# Patient Record
Sex: Female | Born: 1945 | Race: White | Hispanic: Yes | Marital: Married | Smoking: Former smoker
Health system: Southern US, Community
[De-identification: ages and names within clinical notes are randomized; demographics above are authoritative.]

## PROBLEM LIST (undated history)

## (undated) DIAGNOSIS — D179 Benign lipomatous neoplasm, unspecified: Secondary | ICD-10-CM

## (undated) DIAGNOSIS — E2839 Other primary ovarian failure: Secondary | ICD-10-CM

## (undated) DIAGNOSIS — Z1231 Encounter for screening mammogram for malignant neoplasm of breast: Secondary | ICD-10-CM

## (undated) DIAGNOSIS — R42 Dizziness and giddiness: Secondary | ICD-10-CM

## (undated) DIAGNOSIS — Z0000 Encounter for general adult medical examination without abnormal findings: Secondary | ICD-10-CM

## (undated) DIAGNOSIS — R519 Headache, unspecified: Secondary | ICD-10-CM

## (undated) DIAGNOSIS — F4541 Pain disorder exclusively related to psychological factors: Secondary | ICD-10-CM

## (undated) DIAGNOSIS — E559 Vitamin D deficiency, unspecified: Secondary | ICD-10-CM

## (undated) DIAGNOSIS — R21 Rash and other nonspecific skin eruption: Secondary | ICD-10-CM

## (undated) DIAGNOSIS — H9190 Unspecified hearing loss, unspecified ear: Secondary | ICD-10-CM

## (undated) DIAGNOSIS — E7800 Pure hypercholesterolemia, unspecified: Secondary | ICD-10-CM

## (undated) DIAGNOSIS — M8580 Other specified disorders of bone density and structure, unspecified site: Secondary | ICD-10-CM

## (undated) DIAGNOSIS — R51 Headache: Secondary | ICD-10-CM

## (undated) DIAGNOSIS — Z8049 Family history of malignant neoplasm of other genital organs: Secondary | ICD-10-CM

## (undated) HISTORY — DX: Dizziness and giddiness: R42

## (undated) HISTORY — DX: Encounter for screening mammogram for malignant neoplasm of breast: Z12.31

## (undated) HISTORY — PX: TONSILLECTOMY: SUR1361

## (undated) HISTORY — DX: Encounter for general adult medical examination without abnormal findings: Z00.00

## (undated) HISTORY — DX: Pain disorder exclusively related to psychological factors: F45.41

## (undated) HISTORY — DX: Benign lipomatous neoplasm, unspecified: D17.9

## (undated) HISTORY — DX: Vitamin D deficiency, unspecified: E55.9

## (undated) HISTORY — DX: Rash and other nonspecific skin eruption: R21

## (undated) HISTORY — DX: Other primary ovarian failure: E28.39

## (undated) HISTORY — DX: Other specified disorders of bone density and structure, unspecified site: M85.80

## (undated) HISTORY — DX: Pure hypercholesterolemia, unspecified: E78.00

## (undated) HISTORY — DX: Unspecified hearing loss, unspecified ear: H91.90

## (undated) HISTORY — DX: Family history of malignant neoplasm of other genital organs: Z80.49

## (undated) HISTORY — PX: OTHER SURGICAL HISTORY: SHX169

---

## 2004-02-05 ENCOUNTER — Ambulatory Visit: Payer: Self-pay | Admitting: Family Medicine

## 2004-04-04 ENCOUNTER — Other Ambulatory Visit: Admission: RE | Admit: 2004-04-04 | Discharge: 2004-04-04 | Payer: Self-pay | Admitting: Family Medicine

## 2004-04-04 ENCOUNTER — Ambulatory Visit: Payer: Self-pay | Admitting: Family Medicine

## 2004-04-23 ENCOUNTER — Ambulatory Visit: Payer: Self-pay | Admitting: Family Medicine

## 2004-05-28 ENCOUNTER — Ambulatory Visit: Payer: Self-pay | Admitting: Family Medicine

## 2005-09-30 ENCOUNTER — Encounter (INDEPENDENT_AMBULATORY_CARE_PROVIDER_SITE_OTHER): Payer: Self-pay | Admitting: Specialist

## 2005-09-30 ENCOUNTER — Other Ambulatory Visit: Admission: RE | Admit: 2005-09-30 | Discharge: 2005-09-30 | Payer: Self-pay | Admitting: Family Medicine

## 2005-09-30 ENCOUNTER — Ambulatory Visit: Payer: Self-pay | Admitting: Family Medicine

## 2005-10-16 ENCOUNTER — Ambulatory Visit: Payer: Self-pay | Admitting: Family Medicine

## 2005-12-05 ENCOUNTER — Ambulatory Visit: Payer: Self-pay | Admitting: Family Medicine

## 2006-02-25 ENCOUNTER — Ambulatory Visit: Payer: Self-pay | Admitting: Gastroenterology

## 2007-01-12 ENCOUNTER — Encounter (INDEPENDENT_AMBULATORY_CARE_PROVIDER_SITE_OTHER): Payer: Self-pay | Admitting: Internal Medicine

## 2007-01-21 ENCOUNTER — Ambulatory Visit: Payer: Self-pay | Admitting: Family Medicine

## 2007-01-21 ENCOUNTER — Encounter (INDEPENDENT_AMBULATORY_CARE_PROVIDER_SITE_OTHER): Payer: Self-pay | Admitting: Internal Medicine

## 2007-01-21 LAB — CONVERTED CEMR LAB
Epithelial cells, urine: 0 /lpf
Glucose, Urine, Semiquant: NEGATIVE
Ketones, urine, test strip: NEGATIVE
Nitrite: NEGATIVE
Protein, U semiquant: NEGATIVE
pH: 6.5

## 2007-01-27 ENCOUNTER — Ambulatory Visit: Payer: Self-pay | Admitting: Family Medicine

## 2007-01-27 ENCOUNTER — Encounter (INDEPENDENT_AMBULATORY_CARE_PROVIDER_SITE_OTHER): Payer: Self-pay | Admitting: Internal Medicine

## 2007-01-27 ENCOUNTER — Other Ambulatory Visit: Admission: RE | Admit: 2007-01-27 | Discharge: 2007-01-27 | Payer: Self-pay | Admitting: Family Medicine

## 2007-01-27 DIAGNOSIS — M8580 Other specified disorders of bone density and structure, unspecified site: Secondary | ICD-10-CM

## 2007-02-08 ENCOUNTER — Encounter (INDEPENDENT_AMBULATORY_CARE_PROVIDER_SITE_OTHER): Payer: Self-pay | Admitting: Internal Medicine

## 2007-02-09 ENCOUNTER — Encounter (INDEPENDENT_AMBULATORY_CARE_PROVIDER_SITE_OTHER): Payer: Self-pay | Admitting: *Deleted

## 2007-02-12 ENCOUNTER — Ambulatory Visit: Payer: Self-pay | Admitting: Internal Medicine

## 2007-02-22 ENCOUNTER — Telehealth: Payer: Self-pay | Admitting: Family Medicine

## 2007-03-03 ENCOUNTER — Encounter (INDEPENDENT_AMBULATORY_CARE_PROVIDER_SITE_OTHER): Payer: Self-pay | Admitting: *Deleted

## 2007-04-21 ENCOUNTER — Ambulatory Visit: Payer: Self-pay | Admitting: Family Medicine

## 2007-04-21 ENCOUNTER — Encounter (INDEPENDENT_AMBULATORY_CARE_PROVIDER_SITE_OTHER): Payer: Self-pay | Admitting: Internal Medicine

## 2007-04-22 ENCOUNTER — Encounter (INDEPENDENT_AMBULATORY_CARE_PROVIDER_SITE_OTHER): Payer: Self-pay | Admitting: Internal Medicine

## 2007-04-23 ENCOUNTER — Encounter (INDEPENDENT_AMBULATORY_CARE_PROVIDER_SITE_OTHER): Payer: Self-pay | Admitting: *Deleted

## 2007-06-29 ENCOUNTER — Emergency Department: Payer: Self-pay | Admitting: Emergency Medicine

## 2007-06-29 ENCOUNTER — Encounter: Payer: Self-pay | Admitting: Family Medicine

## 2007-06-29 ENCOUNTER — Other Ambulatory Visit: Payer: Self-pay

## 2007-07-14 ENCOUNTER — Telehealth: Payer: Self-pay | Admitting: Family Medicine

## 2007-10-07 ENCOUNTER — Telehealth (INDEPENDENT_AMBULATORY_CARE_PROVIDER_SITE_OTHER): Payer: Self-pay | Admitting: *Deleted

## 2008-02-03 ENCOUNTER — Telehealth: Payer: Self-pay | Admitting: Family Medicine

## 2008-05-22 ENCOUNTER — Telehealth: Payer: Self-pay | Admitting: Family Medicine

## 2008-07-25 ENCOUNTER — Telehealth: Payer: Self-pay | Admitting: Family Medicine

## 2008-11-22 ENCOUNTER — Ambulatory Visit: Payer: Self-pay | Admitting: Family Medicine

## 2008-11-22 DIAGNOSIS — H9190 Unspecified hearing loss, unspecified ear: Secondary | ICD-10-CM

## 2008-11-22 DIAGNOSIS — R42 Dizziness and giddiness: Secondary | ICD-10-CM

## 2008-11-22 HISTORY — DX: Unspecified hearing loss, unspecified ear: H91.90

## 2008-11-22 HISTORY — DX: Dizziness and giddiness: R42

## 2008-11-23 ENCOUNTER — Encounter: Payer: Self-pay | Admitting: Family Medicine

## 2008-11-23 ENCOUNTER — Ambulatory Visit: Payer: Self-pay | Admitting: Family Medicine

## 2008-11-27 ENCOUNTER — Encounter (INDEPENDENT_AMBULATORY_CARE_PROVIDER_SITE_OTHER): Payer: Self-pay | Admitting: *Deleted

## 2008-12-05 ENCOUNTER — Ambulatory Visit: Payer: Self-pay | Admitting: Unknown Physician Specialty

## 2009-03-02 ENCOUNTER — Telehealth: Payer: Self-pay | Admitting: Family Medicine

## 2009-07-23 ENCOUNTER — Telehealth: Payer: Self-pay | Admitting: Family Medicine

## 2009-10-01 ENCOUNTER — Other Ambulatory Visit: Admission: RE | Admit: 2009-10-01 | Discharge: 2009-10-01 | Payer: Self-pay | Admitting: Family Medicine

## 2009-10-01 ENCOUNTER — Ambulatory Visit: Payer: Self-pay | Admitting: Family Medicine

## 2009-10-01 DIAGNOSIS — D126 Benign neoplasm of colon, unspecified: Secondary | ICD-10-CM | POA: Insufficient documentation

## 2009-10-01 LAB — CONVERTED CEMR LAB
Albumin: 4.2 g/dL (ref 3.5–5.2)
BUN: 21 mg/dL (ref 6–23)
Basophils Absolute: 0 10*3/uL (ref 0.0–0.1)
CO2: 29 meq/L (ref 19–32)
Eosinophils Absolute: 0.1 10*3/uL (ref 0.0–0.7)
GFR calc non Af Amer: 89.66 mL/min (ref >60)
Glucose, Bld: 92 mg/dL (ref 70–99)
HCT: 40.3 % (ref 36.0–46.0)
Lymphs Abs: 1.6 10*3/uL (ref 0.7–4.0)
MCHC: 34.7 g/dL (ref 30.0–36.0)
Monocytes Absolute: 0.4 10*3/uL (ref 0.1–1.0)
Monocytes Relative: 8 % (ref 3.0–12.0)
Neutro Abs: 2.3 10*3/uL (ref 1.4–7.7)
Platelets: 259 10*3/uL (ref 150.0–400.0)
Potassium: 4.4 meq/L (ref 3.5–5.1)
RDW: 14.1 % (ref 11.5–14.6)
TSH: 2.2 microintl units/mL (ref 0.35–5.50)
Total Bilirubin: 0.7 mg/dL (ref 0.3–1.2)

## 2009-10-02 LAB — CONVERTED CEMR LAB: Vit D, 25-Hydroxy: 29 ng/mL — ABNORMAL LOW (ref 30–89)

## 2009-10-05 LAB — CONVERTED CEMR LAB
Cholesterol: 234 mg/dL — ABNORMAL HIGH (ref 0–200)
HDL: 83.5 mg/dL (ref >39.00)
Triglycerides: 58 mg/dL (ref 0.0–149.0)

## 2009-10-08 ENCOUNTER — Encounter: Payer: Self-pay | Admitting: Family Medicine

## 2009-12-14 ENCOUNTER — Telehealth: Payer: Self-pay | Admitting: Family Medicine

## 2010-01-08 ENCOUNTER — Ambulatory Visit: Payer: Self-pay | Admitting: Family Medicine

## 2010-01-08 DIAGNOSIS — E559 Vitamin D deficiency, unspecified: Secondary | ICD-10-CM

## 2010-01-08 DIAGNOSIS — E7800 Pure hypercholesterolemia, unspecified: Secondary | ICD-10-CM | POA: Insufficient documentation

## 2010-01-08 HISTORY — DX: Vitamin D deficiency, unspecified: E55.9

## 2010-01-08 HISTORY — DX: Pure hypercholesterolemia, unspecified: E78.00

## 2010-01-15 LAB — CONVERTED CEMR LAB
ALT: 14 units/L (ref 0–35)
Cholesterol: 243 mg/dL — ABNORMAL HIGH (ref 0–200)
HDL: 86.3 mg/dL (ref >39.00)
Total CHOL/HDL Ratio: 3
Triglycerides: 57 mg/dL (ref 0.0–149.0)

## 2010-02-06 ENCOUNTER — Ambulatory Visit: Payer: Self-pay | Admitting: Family Medicine

## 2010-02-06 ENCOUNTER — Encounter: Payer: Self-pay | Admitting: Family Medicine

## 2010-02-06 LAB — HM MAMMOGRAPHY: HM Mammogram: NORMAL

## 2010-02-11 ENCOUNTER — Encounter: Payer: Self-pay | Admitting: Family Medicine

## 2010-02-11 ENCOUNTER — Encounter (INDEPENDENT_AMBULATORY_CARE_PROVIDER_SITE_OTHER): Payer: Self-pay | Admitting: *Deleted

## 2010-02-13 ENCOUNTER — Ambulatory Visit: Payer: Self-pay | Admitting: Family Medicine

## 2010-02-13 ENCOUNTER — Encounter: Payer: Self-pay | Admitting: Family Medicine

## 2010-02-19 ENCOUNTER — Encounter: Payer: Self-pay | Admitting: Family Medicine

## 2010-04-04 ENCOUNTER — Ambulatory Visit: Payer: Self-pay | Admitting: Gastroenterology

## 2010-04-04 ENCOUNTER — Encounter: Payer: Self-pay | Admitting: Family Medicine

## 2010-04-08 LAB — PATHOLOGY REPORT

## 2010-04-09 ENCOUNTER — Ambulatory Visit
Admission: RE | Admit: 2010-04-09 | Discharge: 2010-04-09 | Payer: Self-pay | Source: Home / Self Care | Attending: Family Medicine | Admitting: Family Medicine

## 2010-04-16 NOTE — Miscellaneous (Signed)
Summary: pap screening  Clinical Lists Changes  Observations: Added new observation of PAP SMEAR: normal (10/01/2009 10:48)      Preventive Care Screening  Pap Smear:    Date:  10/01/2009    Results:  normal

## 2010-04-16 NOTE — Letter (Signed)
Summary: Results Follow up Letter  [place] at [facility]  [address] [place], [place] [address]   Phone: [phone]  Fax: [phone]    10/08/2009 MRN: [id number]    Kelsey Haley [address] [place], [place]  [address]    Dear [name],  The following are the results of your recent test(s):  Test         Result    Pap Smear:        Normal __X___  Not Normal _____ Comments: ______________________________________________________ Cholesterol: LDL(Bad cholesterol):         Your goal is less than:         HDL (Good cholesterol):       Your goal is more than: Comments:  ______________________________________________________ Mammogram:        Normal _____  Not Normal _____ Comments:  ___________________________________________________________________ Hemoccult:        Normal _____  Not normal _______ Comments:    _____________________________________________________________________ Other Tests:    We routinely do not discuss normal results over the telephone.  If you desire a copy of the results, or you have any questions about this information we can discuss them at your next office visit.   Sincerely,    Idamae Schuller Tower,MD  MT/ri

## 2010-04-16 NOTE — Progress Notes (Signed)
Summary: Rx Esgic  Phone Note Refill Request Call back at [phone] Message from:  [facility] on Jul 23, 2009 11:45 AM  Refills Requested: Medication #1:  ESGIC-PLUS 50-500-40 MG  CAPS 1 by mouth q 4-6 hours as needed headache   Last Refilled: 03/05/2009 Received faxed refill request please advise.  Patient has not been seen since 11/2008 but has an appt in July with Dr. Milinda Antis.   Method Requested: Electronic Initial call taken by: Linde Gillis CMA Duncan Dull),  Jul 23, 2009 11:46 AM  Follow-up for Phone Call        px written on EMR for call in  Follow-up by: Judith Part MD,  Jul 23, 2009 1:19 PM  Additional Follow-up for Phone Call Additional follow up Details #1::        Medication phoned to pharmacy.  Additional Follow-up by: Delilah Shan CMA Duncan Dull),  Jul 23, 2009 2:34 PM    Prescriptions: ESGIC-PLUS 50-500-40 MG  CAPS (BUTALBITAL-APAP-CAFFEINE) 1 by mouth q 4-6 hours as needed headache  #30 x 0   Entered and Authorized by:   Judith Part MD   Signed by:   Delilah Shan CMA (AAMA) on 07/23/2009   Method used:   Telephoned to ...       [facility] [address] [phone]* (retail)       [address] [place], [place]  [address]       Ph: [phone]       Fax: [phone]   RxID:   [id number]

## 2010-04-16 NOTE — Assessment & Plan Note (Signed)
Summary: CPX / LFW   Vital Signs:  Patient profile:   65 year old female Height:      62.5 inches Weight:      151.50 pounds BMI:     27.37 Temp:     98.2 degrees F oral Pulse rate:   68 / minute Pulse rhythm:   regular BP sitting:   114 / 74  (left arm) Cuff size:   regular  Vitals Entered By: Lewanda Rife LPN (October 01, 2009 10:35 AM) CC: CPX with pap and breast exam LMP 1992   History of Present Illness: here for health mt exam and to rev chronic med problems   is doing well and traveling a lot  is feeling ok   wt is up 10 lb this year  used to eat healthy food - then fell off the wagon with traveling  hard to eat vegetable  not exercising regularly due to staying so busy   hx of osteopenia - ? last dexa ca and D   mam 9/10 wants to go ahead and schedule mam  no lumps   pap 08 no gyn problems or bleeding   Td status -- was 2003  zoster status - does not want vaccine and does not want flu shot   colon cancer screen -- had colonosc 3-4 years ago found a small polyp - thinks she is overdue for 3 year follow up -- wants to schedule in nov   took med for fungal toenail- got med from Dr Ether Griffins- will f/u is coming back     Allergies (verified): No Known Drug Allergies  Past History:  Social History: Last updated: 01/27/2007 Marital Status: Married Children: 3--36,33,24--youngest lives in 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 seb derm onchomycosis       pod- Ether Griffins dermJarold Motto  Family History: much of family lives overseas  some HTN   Review of Systems General:  Denies fatigue, loss of appetite, and malaise. Eyes:  Denies blurring and eye  irritation. CV:  Complains of weight gain; denies chest pain or discomfort, palpitations, and swelling of feet. Resp:  Denies cough, shortness of breath, and wheezing. GI:  Denies abdominal pain, bloody stools, change in bowel habits, and nausea. GU:  Denies abnormal vaginal bleeding, discharge, hematuria, urinary frequency, and urinary hesitancy. MS:  Denies joint pain, joint redness, joint swelling, and muscle weakness. Derm:  Complains of hair loss and rash; denies itching; loosing more hair lately- sees derm soon also ? eczema on face . Neuro:  Denies numbness and tingling. Psych:  Denies anxiety and depression. Endo:  Denies cold intolerance, excessive thirst, excessive urination, and heat intolerance. Heme:  Denies abnormal bruising and bleeding.  Physical Exam  General:  Well-developed,well-nourished,in no acute distress; alert,appropriate and cooperative throughout examination Head:  normocephalic, atraumatic, and no abnormalities observed.   Eyes:  vision grossly intact, pupils equal, pupils round, and pupils reactive to light.  no conjunctival pallor, injection or icterus  Ears:  R ear normal and L ear normal.   Nose:  no nasal discharge.   Mouth:  pharynx pink and moist.   Neck:  supple with full rom and no masses or thyromegally, no JVD or carotid bruit  Chest Wall:  No deformities,  masses, or tenderness noted. Breasts:  No mass, nodules, thickening, tenderness, bulging, retraction, inflamation, nipple discharge or skin changes noted.   Lungs:  Normal respiratory effort, chest expands symmetrically. Lungs are clear to auscultation, no crackles or wheezes. Heart:  Normal rate and regular rhythm. S1 and S2 normal without gallop, murmur, click, rub or other extra sounds. Abdomen:  Bowel sounds positive,abdomen soft and non-tender without masses, organomegaly or hernias noted. no renal bruits  Genitalia:  Normal introitus for age, no external lesions, no vaginal discharge, mucosa  pink and moist, no vaginal or cervical lesions, no vaginal atrophy, no friaility or hemorrhage, normal uterus size and position, no adnexal masses or tenderness Msk:  No deformity or scoliosis noted of thoracic or lumbar spine.  no acute joint changes  Skin:  Intact without suspicious lesions or rashes some scale in nasolabial folds and forehead resembling seb dermatitis no focal allopeica noted slt thickening R first toenail- nontender Cervical Nodes:  No lymphadenopathy noted Axillary Nodes:  No palpable lymphadenopathy Inguinal Nodes:  No significant adenopathy Psych:  normal affect, talkative and pleasant    Impression & Recommendations:  Problem # 1:  HEALTH MAINTENANCE EXAM (ICD-V70.0) reviewed health habits including diet, exercise and skin cancer prevention reviewed health maintenance list and family history emph imp of better diet and exercise for her health Orders: Venipuncture (64403) TLB-BMP (Basic Metabolic Panel-BMET) (80048-METABOL) TLB-CBC Platelet - w/Differential (85025-CBCD) TLB-Hepatic/Liver Function Pnl (80076-HEPATIC) TLB-TSH (Thyroid Stimulating Hormone) (84443-TSH) T-Vitamin D (25-Hydroxy) (47425-95638)  Problem # 2:  ROUTINE GYNECOLOGICAL EXAMINATION (ICD-V72.31) exam and pap done  Problem # 3:  COLONIC POLYPS (ICD-211.3) Assessment: Unchanged schedule 3 year f/u colonosc -pt unsure who did last one  no stool changes Orders: Gastroenterology Referral (GI)  Problem # 4:  OTHER SCREENING MAMMOGRAM (ICD-V76.12) annual mammogram scheduled adv pt to continue regular self breast exams non remarkable breast exam today  Orders: Radiology Referral (Radiology)  Complete Medication List: 1)  Multivitamin  .Marland Kitchen.. 1 qd 2)  Esgic-plus 50-500-40 Mg Caps (Butalbital-apap-caffeine) .Marland Kitchen.. 1 by mouth q 4-6 hours as needed headache 3)  Antivert 25 Mg Tabs (Meclizine hcl) .... 1/2-1 by mouth up to every6- 8 hours as needed dizziness/ vertigo 4)  Flonase 50 Mcg/act Susp  (Fluticasone propionate) .... 2 sprays in each nostril once daily for allergies or ear symptoms 5)  Glucosamine-chondroitin Caps (Glucosamine-chondroit-vit c-mn) .... Take two by mouth daily 6)  Biotin ?mg  .... Take 1 tablet by mouth once a day 7)  Calcium Carbonate-vitamin D 600-400 Mg-unit Tabs (Calcium carbonate-vitamin d) .... Take one tablet by mouth twice a day 8)  Vitamin E 600 Unit Caps (Vitamin e) .... Take 1 capsule by mouth once a day 9)  Vitamin C 1000 Mg Tabs (Ascorbic acid) .... Take 1 tablet by mouth once a day 10)  Triamcinolone Acetonide 0.1 % Crea (Triamcinolone acetonide) .... Apply to affected area as needed. 11)  Desowen Oint W/cetaphil Lot 0.05 % Kit (Desonide oint-moisturizing lot) .... Apply to affected area as needed 12)  Sertal  .... As needed for upset stomach  Other Orders: Specimen Handling (75643)  Patient Instructions: 1)  get back to healthy diet and exercise  2)  we will schedule mammogram at check out 3)  we will schedule colonoscopy at check out 4)  we will schedule bone density test at check out  5)  the current recommendation for calcium intake is 1200-1500 mg daily with 1000 IU of vitamin D  6)  labs today for wellness 7)  follow up with dermatology for hair and skin issues    Preventive Care Screening  Last Tetanus Booster:    Date:  03/17/2001    Results:  Td   Contraindications of Treatment or Deferment of Test/Procedure:    Test/Procedure: FLU VAX    Reason for deferment: patient declined     Test/Procedure: Zoster vaccine    Reason for deferment: declined     Current Allergies (reviewed today): No known allergies

## 2010-04-16 NOTE — Progress Notes (Signed)
Summary: Esgic  Phone Note Refill Request Message from:  Fax from Pharmacy on December 14, 2009 9:10 AM  Refills Requested: Medication #1:  ESGIC-PLUS 50-500-40 MG  CAPS 1 by mouth q 4-6 hours as needed headache   Last Refilled: 07/23/2009 [facility] [address]  Initial call taken by: Melody Comas,  December 14, 2009 9:14 AM  Follow-up for Phone Call        px written on EMR for call in  Follow-up by: Judith Part MD,  December 14, 2009 10:02 AM  Additional Follow-up for Phone Call Additional follow up Details #1::        Medication phoned [facility] [address] [facility] as instructed. Lewanda Rife LPN  December 14, 2009 11:15 AM     Prescriptions: ESGIC-PLUS 50-500-40 MG  CAPS (BUTALBITAL-APAP-CAFFEINE) 1 by mouth q 4-6 hours as needed headache  #30 x 0   Entered and Authorized by:   Judith Part MD   Signed by:   Lewanda Rife LPN on 14/78/2956   Method used:   Telephoned to ...         RxID:   [id number]

## 2010-04-16 NOTE — Letter (Signed)
Summary: Results Follow up Letter  [place] at [facility]  [address] [place], [place] [address]   Phone: [phone]  Fax: [phone]    02/11/2010 MRN: [id number]  [name] [address] [place], [place]  [address]  Dear Ms. Grosser,  The following are the results of your recent test(s):  Test         Result    Pap Smear:        Normal _____  Not Normal _____ Comments: ______________________________________________________ Cholesterol: LDL(Bad cholesterol):         Your goal is less than:         HDL (Good cholesterol):       Your goal is more than: Comments:  ______________________________________________________ Mammogram:        Normal __X___  Not Normal _____ Comments:Repeat in 1 year  ___________________________________________________________________ Hemoccult:        Normal _____  Not normal _______ Comments:    _____________________________________________________________________ Other Tests:    We routinely do not discuss normal results over the telephone.  If you desire a copy of the results, or you have any questions about this information we can discuss them at your next office visit.   Sincerely,      Sharilyn Sites for Dr. Roxy Manns

## 2010-04-16 NOTE — Letter (Signed)
Summary: Results Follow up Letter  [place] at [facility]  [address] [place], [place] [address]   Phone: [phone]  Fax: [phone]    02/19/2010 MRN: [id number]    Kelsey Haley [address] [place], [place]  [address]    Dear Ms. Fons,  The following are the results of your recent test(s):  Test         Result    Pap Smear:        Normal _____  Not Normal _____ Comments: ______________________________________________________ Cholesterol: LDL(Bad cholesterol):         Your goal is less than:         HDL (Good cholesterol):       Your goal is more than: Comments:  ______________________________________________________ Mammogram:        Normal _____  Not Normal _____ Comments:  ___________________________________________________________________ Hemoccult:        Normal _____  Not normal _______ Comments:    _____________________________________________________________________ Other Tests:the bone density showed osteopenia. Dr Milinda Antis said can discuss in more detail at follow up appt. Continue taking Calcium and Vitamin D and stay active. Will want to recheck bone density test in 2 years.    We routinely do not discuss normal results over the telephone.  If you desire a copy of the results, or you have any questions about this information we can discuss them at your next office visit.   Sincerely,    Idamae Schuller Tower,MD  MT/ri

## 2010-04-16 NOTE — Miscellaneous (Signed)
  Clinical Lists Changes  Observations: Added new observation of MAMMO DUE: 02/2011 (02/11/2010 13:25) Added new observation of MAMMOGRAM: normal (02/06/2010 13:25)      Preventive Care Screening  Mammogram:    Date:  02/06/2010    Next Due:  02/2011    Results:  normal

## 2010-04-18 ENCOUNTER — Telehealth: Payer: Self-pay | Admitting: Family Medicine

## 2010-04-18 NOTE — Assessment & Plan Note (Signed)
Summary: FOLLOW UP BONE DENSITY RESULTS/RI   Vital Signs:  Patient profile:   65 year old female Height:      62.5 inches Weight:      156.25 pounds BMI:     28.22 Temp:     98 degrees F oral Pulse rate:   64 / minute Pulse rhythm:   regular BP sitting:   118 / 72  (left arm) Cuff size:   regular  Vitals Entered By: Lewanda Rife LPN (April 09, 2010 9:22 AM) CC: follow-up visit to discuss  bone density results   History of Present Illness: here for f/u of dexa results  had scores LS T score -1.6 and FN -0.6  no history of fracture   no hx of jaw tumor/ dental problem or   had vit D def- improved in oct with level ov 50 ca and D calcium plus d 600 (getting 2000-3000 international units D daily)   exercise - is going very well- bike and fast walking   bmi is 28  fam hx -- ? if mom had OP when very old   pt had colonosc 1/19- small polyp Dr Bluford Kaufmann-- and will recheck 5 years   Preventive Care Screening  Colonoscopy:    Date:  04/02/2010    Next Due:  04/2015    Results:  Adenomatous Polyp    Allergies (verified): No Known Drug Allergies  Past History:  Past Medical History: vertigo hearing loss osteopenia  rash face - ? seb derm onchomycosis       pod- Ether Griffins dermJarold Motto GI Dr Bluford Kaufmann  Physical Exam  Msk:  petite frame  no kyphosis   Impression & Recommendations:  Problem # 1:  OSTEOPENIA (ICD-733.90) Assessment Deteriorated disc dexa results in detail  pt is eager to tx this  px fosamax- disc poss side eff and risks plan for 5 y course if no problems dexa 2 y exercise and ca and D discussed  Her updated medication list for this problem includes:    Calcium Carbonate-vitamin D 600-400 Mg-unit Tabs (Calcium carbonate-vitamin d) .Marland Kitchen... Take one tablet by mouth twice a day    Fosamax 70 Mg Tabs (Alendronate sodium) .Marland Kitchen... 1 by mouth once weekly as directed  Complete Medication List: 1)  Multivitamin  .Marland Kitchen.. 1 qd 2)  Esgic-plus 50-500-40 Mg  Caps (Butalbital-apap-caffeine) .Marland Kitchen.. 1 by mouth q 4-6 hours as needed headache 3)  Antivert 25 Mg Tabs (Meclizine hcl) .... 1/2-1 by mouth up to every6- 8 hours as needed dizziness/ vertigo 4)  Flonase 50 Mcg/act Susp (Fluticasone propionate) .... 2 sprays in each nostril once daily for allergies or ear symptoms 5)  Glucosamine-chondroitin Caps (Glucosamine-chondroit-vit c-mn) .... Take two by mouth daily 6)  Calcium Carbonate-vitamin D 600-400 Mg-unit Tabs (Calcium carbonate-vitamin d) .... Take one tablet by mouth twice a day 7)  Vitamin E 600 Unit Caps (Vitamin e) .... Take 1 capsule by mouth once a day 8)  Vitamin C 1000 Mg Tabs (Ascorbic acid) .... Take 1 tablet by mouth once a day 9)  Triamcinolone Acetonide 0.1 % Crea (Triamcinolone acetonide) .... Apply to affected area as needed. 10)  Desowen Oint W/cetaphil Lot 0.05 % Kit (Desonide oint-moisturizing lot) .... Apply to affected area as needed 11)  Sertal  .... As needed for upset stomach 12)  Biotin 5000 Mcg Caps (Biotin) .... Take 1 capsule by mouth once a day 13)  Eql Sinus Congestion/pain Day 5-325 Mg Tabs (Phenylephrine-acetaminophen) .... Otc as directed. 14)  Fosamax  70 Mg Tabs (Alendronate sodium) .Marland Kitchen.. 1 by mouth once weekly as directed  Patient Instructions: 1)  try fosamax and let me know if any side effects or problems 2)  continue calcium and vitamin D 3)  continue exercise  4)  we will plan for next bone density test in about 2 years Prescriptions: FOSAMAX 70 MG TABS (ALENDRONATE SODIUM) 1 by mouth once weekly as directed  #4 x 11   Entered and Authorized by:   Judith Part MD   Signed by:   Judith Part MD on 04/09/2010   Method used:   Electronically to        [facility] [address] [phone]* (retail)       [address] [place], [place]  [address]       Ph: [phone]       Fax: [phone]   RxID:   [id number]      Current Allergies (reviewed today): No known allergies    Preventive  Care Screening  Colonoscopy:    Date:  04/02/2010    Next Due:  04/2015    Results:  Adenomatous Polyp

## 2010-04-18 NOTE — Procedures (Signed)
Summary: Colonoscopy by Dr.Paul [facility]  Colonoscopy by Dr.Paul Oh,[facility]   Imported By: Beau Fanny 04/10/2010 08:19:12  _____________________________________________________________________  External Attachment:    Type:   Image     Comment:   External Document

## 2010-04-24 NOTE — Progress Notes (Signed)
Summary: refill request for esgic  Phone Note Refill Request Message from:  Fax from Pharmacy  Refills Requested: Medication #1:  ESGIC-PLUS 50-500-40 MG  CAPS 1 by mouth q 4-6 hours as needed headache   Last Refilled: 12/14/2009 [facility] [address]  [phone]  Initial call taken by: Lowella Petties CMA, AAMA,  April 18, 2010 3:53 PM  Follow-up for Phone Call        px written on EMR for call in  Follow-up by: Judith Part MD,  April 18, 2010 3:57 PM  Additional Follow-up for Phone Call Additional follow up Details #1::        Medication phoned to [facility] pharmacy as instructed. Lewanda Rife LPN  April 18, 2010 4:58 PM     Prescriptions: ESGIC-PLUS 50-500-40 MG  CAPS (BUTALBITAL-APAP-CAFFEINE) 1 by mouth q 4-6 hours as needed headache  #30 x 0   Entered and Authorized by:   Judith Part MD   Signed by:   Lewanda Rife LPN on 81/19/1478   Method used:   Telephoned to ...       [facility] [address] [address] [phone]* (retail)       [address] [place], [place]  [address]       Ph: [phone]       Fax: [phone]   RxID:   [id number]

## 2010-04-24 NOTE — Procedures (Signed)
Summary: [facility] / COLONOSCOPY / DR. PAUL [facility] / COLONOSCOPY / DR. PAUL [place]   Imported By: Carin Primrose 04/15/2010 17:06:45  _____________________________________________________________________  External Attachment:    Type:   Image     Comment:   External Document

## 2010-07-06 ENCOUNTER — Other Ambulatory Visit: Payer: Self-pay | Admitting: Family Medicine

## 2010-07-08 ENCOUNTER — Other Ambulatory Visit: Payer: Self-pay

## 2010-07-08 MED ORDER — MECLIZINE HCL 25 MG PO TABS: ORAL_TABLET | ORAL | Status: DC

## 2010-07-08 NOTE — Telephone Encounter (Signed)
Medication phoned to [facility] [address] pharmacy as instructed.  

## 2010-07-08 NOTE — Telephone Encounter (Signed)
[facility] [address] request refill on Meclizine 25mg . Med is on med list in centricity but I cannot see where our office has ever filled med.Please advise.

## 2010-07-08 NOTE — Telephone Encounter (Signed)
Px written for call in   

## 2010-08-02 NOTE — Assessment & Plan Note (Signed)
[facility]                                 ON-CALL NOTE   Kelsey Haley, Kelsey Haley                           MRN:          [id number]  DATE:06/26/2007                            DOB:          [birth year]    Caller:  Alan Mulder, daughter.  Phone number [phone].   PRIMARY CARE PHYSICIAN:  Marne A. Tower, MD   SUBJECTIVE:  Twenty-four hours of vertigo and nausea.  No other  neurologic symptoms, no chest pain or shortness of breath.   ASSESSMENT AND PLAN:  I discussed the possibility of benign positional  vertigo.  She will use Dramamine over the weekend and will avoid  driving.  If she has any new symptoms, she will be evaluated in Urgent  Care.  Otherwise she will follow up if needed with her regular doctor.     Kerby Nora, MD  Electronically Signed    AB/MedQ  DD: 06/26/2007  DT: 06/26/2007  Job #: [id number]

## 2010-09-26 ENCOUNTER — Other Ambulatory Visit: Payer: Self-pay | Admitting: Family Medicine

## 2010-09-26 MED ORDER — BUTALBITAL-APAP-CAFFEINE 50-500-40 MG PO CAPS: ORAL_CAPSULE | ORAL | Status: DC

## 2010-09-26 NOTE — Telephone Encounter (Signed)
Px written for call in   

## 2010-09-26 NOTE — Telephone Encounter (Signed)
Medication phoned to [facility] [place] [facility] with [facility] as instructed.

## 2010-11-04 ENCOUNTER — Other Ambulatory Visit: Payer: Self-pay | Admitting: Family Medicine

## 2010-11-05 NOTE — Telephone Encounter (Signed)
Will refill electronically  

## 2010-11-05 NOTE — Telephone Encounter (Signed)
[facility] [place] request refill on Meclizine chew 25mg .Please advise.

## 2010-12-16 ENCOUNTER — Other Ambulatory Visit: Payer: Self-pay | Admitting: Family Medicine

## 2010-12-16 NOTE — Telephone Encounter (Signed)
Will refill electronically  

## 2010-12-16 NOTE — Telephone Encounter (Signed)
[facility] request refill 1) Zebutal 50-500-40 last refilled 09/27/10 and Travel sickness chew 25 mg . Last refilled 11/06/10.Please advise.

## 2011-03-21 ENCOUNTER — Other Ambulatory Visit: Payer: Self-pay | Admitting: Family Medicine

## 2011-06-02 ENCOUNTER — Ambulatory Visit (INDEPENDENT_AMBULATORY_CARE_PROVIDER_SITE_OTHER): Payer: Self-pay | Admitting: Family Medicine

## 2011-06-02 ENCOUNTER — Encounter: Payer: Self-pay | Admitting: Family Medicine

## 2011-06-02 VITALS — BP 124/80 | HR 68 | Temp 97.8°F | Ht 63.0 in | Wt 155.5 lb

## 2011-06-02 DIAGNOSIS — E7800 Pure hypercholesterolemia, unspecified: Secondary | ICD-10-CM

## 2011-06-02 DIAGNOSIS — D126 Benign neoplasm of colon, unspecified: Secondary | ICD-10-CM

## 2011-06-02 DIAGNOSIS — Z1231 Encounter for screening mammogram for malignant neoplasm of breast: Secondary | ICD-10-CM

## 2011-06-02 DIAGNOSIS — E559 Vitamin D deficiency, unspecified: Secondary | ICD-10-CM

## 2011-06-02 DIAGNOSIS — Z23 Encounter for immunization: Secondary | ICD-10-CM

## 2011-06-02 DIAGNOSIS — M949 Disorder of cartilage, unspecified: Secondary | ICD-10-CM

## 2011-06-02 DIAGNOSIS — M899 Disorder of bone, unspecified: Secondary | ICD-10-CM

## 2011-06-02 LAB — COMPREHENSIVE METABOLIC PANEL
ALT: 20 U/L (ref 0–35)
AST: 25 U/L (ref 0–37)
Albumin: 4.3 g/dL (ref 3.5–5.2)
Alkaline Phosphatase: 31 U/L — ABNORMAL LOW (ref 39–117)
Potassium: 4.1 mEq/L (ref 3.5–5.1)
Sodium: 140 mEq/L (ref 135–145)
Total Protein: 7.2 g/dL (ref 6.0–8.3)

## 2011-06-02 LAB — CBC WITH DIFFERENTIAL/PLATELET
Basophils Absolute: 0 10*3/uL (ref 0.0–0.1)
Eosinophils Absolute: 0.2 10*3/uL (ref 0.0–0.7)
Eosinophils Relative: 3.5 % (ref 0.0–5.0)
MCV: 98.8 fl (ref 78.0–100.0)
Monocytes Absolute: 0.4 10*3/uL (ref 0.1–1.0)
Neutrophils Relative %: 44.9 % (ref 43.0–77.0)
Platelets: 250 10*3/uL (ref 150.0–400.0)
RDW: 14.2 % (ref 11.5–14.6)
WBC: 4.4 10*3/uL — ABNORMAL LOW (ref 4.5–10.5)

## 2011-06-02 LAB — LIPID PANEL
HDL: 77.1 mg/dL (ref >39.00)
Total CHOL/HDL Ratio: 3
VLDL: 9.4 mg/dL (ref 0.0–40.0)

## 2011-06-02 LAB — LDL CHOLESTEROL, DIRECT: Direct LDL: 142.9 mg/dL

## 2011-06-02 MED ORDER — ALENDRONATE SODIUM 70 MG PO TABS: 70.0000 mg | ORAL_TABLET | ORAL | Status: DC

## 2011-06-02 NOTE — Progress Notes (Signed)
Subjective:    Patient ID: Kelsey Haley, female    DOB: 05-20-1945, 66 y.o.   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 -- pain comes and goes , tries to keep moving and doing exercise   Wt is stable with bmi of 27  bp fine  Eats a very healthy diet  She wants to loose weight - is harder as she gets older  Exercises every day Needs to cut portion sizes   Osteopenia dexa 11/11 -overall mild - will be due next winter  On fosamax-- has been on that for about 14 months (will plan for 5 years)  Is exercising more  On ca and D  Due for D level- def in past   Lipids-due for check Diet controlled- diet very good  Lab Results  Component Value Date   CHOL 243* 01/08/2010   HDL 86.30 01/08/2010   LDLDIRECT 133.3 01/08/2010   TRIG 57.0 01/08/2010   CHOLHDL 3 01/08/2010     colonosc 1/12 adenomatous polyp-will recall in 5 years   mammo 11/11-- thinks she had one  Self exam  Pap 7/11 normal No gyn problems or abn paps  Is due for breast exam   Zoster status- wants to get imm- will check with insurance    Flu shot- got that this fall in phamacy   Pneumovax will do that today  TD was 03- will do that today   Patient Active Problem List  Diagnoses  . COLONIC POLYPS  . UNSPECIFIED VITAMIN D DEFICIENCY  . HYPERCHOLESTEROLEMIA  . UNSPECIFIED HEARING LOSS  . OSTEOPENIA  . INTERMITTENT VERTIGO  . Other screening mammogram   Past Medical History  Diagnosis Date  . Vertigo   . Hearing loss   . Osteopenia    No past surgical history on file. History  Substance Use Topics  . Smoking status: Former Smoker    Quit date: 03/17/1994  . Smokeless tobacco: Not on file  . Alcohol Use: Not on file   No family history on file. No Known Allergies Current Outpatient Prescriptions on File Prior to Visit  Medication Sig Dispense Refill  . meclizine (ANTIVERT) 25 MG tablet 1-2 tablets  by mouth every 8 hours as needed for dizziness and vertigo.  30 tablet  0  . TRAVEL SICKNESS 25 MG CHEW TAKE 1 TO 2 TABLETS BY MOUTH EVERY 8 HOURS AS NEEDED FOR DIZZINESS AND VERTIGO  30 tablet  3  . ZEBUTAL 50-500-40 MG CAPS TAKE 1 CAPSULE BY MOUTH EVERY 4 TO 6 HOURS AS NEEDED FOR HEADACHE  30 capsule  0       Review of Systems Review of Systems  Constitutional: Negative for fever, appetite change, fatigue and unexpected weight change.  Eyes: Negative for pain and visual disturbance.  Respiratory: Negative for cough and shortness of breath.   Cardiovascular: Negative for cp or palpitations    Gastrointestinal: Negative for nausea, diarrhea and constipation.  Genitourinary: Negative for urgency and frequency.  Skin: Negative for pallor or rash   MSK pos for occ pain in L shoulder- not today, neg for joint swelling  Neurological: Negative for weakness, light-headedness, numbness and headaches.  Hematological: Negative for adenopathy. Does not bruise/bleed easily.  Psychiatric/Behavioral: Negative for dysphoric mood. The patient is not nervous/anxious.          Objective:   Physical Exam  Constitutional: She appears well-developed  and well-nourished. No distress.  HENT:  Head: Normocephalic and atraumatic.  Right Ear: External ear normal.  Left Ear: External ear normal.  Nose: Nose normal.  Mouth/Throat: Oropharynx is clear and moist.       Scant cerumen  Eyes: Conjunctivae and EOM are normal. Pupils are equal, round, and reactive to light. Right eye exhibits no discharge. Left eye exhibits no discharge.  Neck: Normal range of motion. Neck supple. No JVD present. Carotid bruit is not present. No thyromegaly present.  Cardiovascular: Normal rate, regular rhythm, normal heart sounds and intact distal pulses.  Exam reveals no gallop.   Pulmonary/Chest: Effort normal and breath sounds normal. No respiratory distress. She has no wheezes.  Abdominal: Soft. Bowel sounds are normal. She  exhibits no distension and no mass. There is no tenderness.  Genitourinary: No breast swelling, tenderness, discharge or bleeding.       Breast exam: No mass, nodules, thickening, tenderness, bulging, retraction, inflamation, nipple discharge or skin changes noted.  No axillary or clavicular LA.  Chaperoned exam.    Musculoskeletal: Normal range of motion. She exhibits no edema and no tenderness.  Lymphadenopathy:    She has no cervical adenopathy.  Neurological: She is alert. She has normal reflexes. No cranial nerve deficit. She exhibits normal muscle tone. Coordination normal.  Skin: Skin is warm and dry. No rash noted. No erythema. No pallor.       Solar lentigos noted  Psychiatric: She has a normal mood and affect.       Cheerful and talkative          Assessment & Plan:

## 2011-06-02 NOTE — Patient Instructions (Signed)
If you are interested in a shingles/zoster vaccine - call your insurance to check on coverage,( you should not get it within 1 month of other vaccines) , then call us for a prescription  for it to take to a pharmacy that gives the shot   We will schedule mammogram at check out  Pneumonia vaccine today Tdap (tetnus) vaccine today   Labs today Keep taking good care of yourself

## 2011-06-02 NOTE — Assessment & Plan Note (Signed)
Check lipids today Fair in past  Rev low sat fat diet  Good habits in general

## 2011-06-02 NOTE — Assessment & Plan Note (Signed)
Level today  Gets sun exp and takes ca plus D Also has mild osteopenia

## 2011-06-02 NOTE — Assessment & Plan Note (Signed)
Is up to date dexa at this time until next winter  Rev ca and D intake D level today with labs Good exercise-enc to keep it up

## 2011-06-02 NOTE — Assessment & Plan Note (Signed)
Scheduled annual screening mammogram Nl breast exam today  Encouraged monthly self exams   

## 2011-06-02 NOTE — Assessment & Plan Note (Signed)
Is up to date on colonoscopy at this time  Will have 5 y recall No symptoms Cbc today

## 2011-06-03 ENCOUNTER — Ambulatory Visit: Payer: Self-pay | Admitting: Family Medicine

## 2011-06-03 ENCOUNTER — Encounter: Payer: Self-pay | Admitting: *Deleted

## 2011-06-04 ENCOUNTER — Encounter: Payer: Self-pay | Admitting: *Deleted

## 2011-06-05 ENCOUNTER — Encounter: Payer: Self-pay | Admitting: Family Medicine

## 2011-06-09 ENCOUNTER — Encounter: Payer: Self-pay | Admitting: *Deleted

## 2011-08-12 ENCOUNTER — Other Ambulatory Visit: Payer: Self-pay | Admitting: Family Medicine

## 2011-09-13 ENCOUNTER — Other Ambulatory Visit: Payer: Self-pay | Admitting: Family Medicine

## 2011-09-19 NOTE — Telephone Encounter (Signed)
Rx called in as directed.   

## 2011-09-19 NOTE — Telephone Encounter (Signed)
Will refill electronically  

## 2011-09-19 NOTE — Telephone Encounter (Signed)
Ok to refill 

## 2011-11-04 ENCOUNTER — Other Ambulatory Visit: Payer: Self-pay | Admitting: Family Medicine

## 2011-11-06 ENCOUNTER — Other Ambulatory Visit: Payer: Self-pay

## 2011-11-18 ENCOUNTER — Other Ambulatory Visit: Payer: Self-pay | Admitting: Family Medicine

## 2012-03-01 ENCOUNTER — Other Ambulatory Visit: Payer: Self-pay | Admitting: *Deleted

## 2012-03-01 MED ORDER — BUTALBITAL-APAP-CAFFEINE 50-500-40 MG PO CAPS: ORAL_CAPSULE | ORAL | Status: DC

## 2012-03-01 NOTE — Telephone Encounter (Signed)
Rx called in as prescribed 

## 2012-03-01 NOTE — Telephone Encounter (Signed)
Px written for call in   

## 2012-05-28 ENCOUNTER — Other Ambulatory Visit: Payer: Self-pay | Admitting: *Deleted

## 2012-05-28 ENCOUNTER — Telehealth: Payer: Self-pay | Admitting: *Deleted

## 2012-05-28 MED ORDER — BUTALBITAL-APAP-CAFFEINE 50-500-40 MG PO CAPS: ORAL_CAPSULE | ORAL | Status: DC

## 2012-05-28 NOTE — Telephone Encounter (Signed)
FYI: pharmacy called and advise me that the zebutal (Rx called in today) is no longer made in 50-500-40 mg, so they changed it to 50-325-40 mg, I updated med list

## 2012-05-28 NOTE — Telephone Encounter (Signed)
That is fine, thanks 

## 2012-05-28 NOTE — Telephone Encounter (Signed)
Rx called in as prescribed 

## 2012-05-28 NOTE — Telephone Encounter (Signed)
Px written for call in   

## 2012-07-27 ENCOUNTER — Other Ambulatory Visit: Payer: Self-pay | Admitting: Family Medicine

## 2012-07-27 NOTE — Telephone Encounter (Signed)
refil for 3 mo , she has upcoming appt, thanks

## 2012-07-27 NOTE — Telephone Encounter (Signed)
Electronic refill request.  Patient has not been seen in > 1 year.  Does she need an OV?

## 2012-07-28 NOTE — Telephone Encounter (Signed)
done

## 2012-08-10 ENCOUNTER — Telehealth: Payer: Self-pay | Admitting: Family Medicine

## 2012-08-10 ENCOUNTER — Other Ambulatory Visit (INDEPENDENT_AMBULATORY_CARE_PROVIDER_SITE_OTHER): Payer: No Typology Code available for payment source

## 2012-08-10 DIAGNOSIS — E7800 Pure hypercholesterolemia, unspecified: Secondary | ICD-10-CM

## 2012-08-10 DIAGNOSIS — M899 Disorder of bone, unspecified: Secondary | ICD-10-CM

## 2012-08-10 DIAGNOSIS — M949 Disorder of cartilage, unspecified: Secondary | ICD-10-CM

## 2012-08-10 DIAGNOSIS — Z0000 Encounter for general adult medical examination without abnormal findings: Secondary | ICD-10-CM

## 2012-08-10 DIAGNOSIS — E559 Vitamin D deficiency, unspecified: Secondary | ICD-10-CM

## 2012-08-10 LAB — COMPREHENSIVE METABOLIC PANEL
ALT: 15 U/L (ref 0–35)
AST: 22 U/L (ref 0–37)
Albumin: 4 g/dL (ref 3.5–5.2)
Calcium: 9.2 mg/dL (ref 8.4–10.5)
Chloride: 104 mEq/L (ref 96–112)
Potassium: 4.1 mEq/L (ref 3.5–5.1)
Sodium: 139 mEq/L (ref 135–145)
Total Protein: 7 g/dL (ref 6.0–8.3)

## 2012-08-10 LAB — CBC WITH DIFFERENTIAL/PLATELET
Basophils Absolute: 0 10*3/uL (ref 0.0–0.1)
Basophils Relative: 0.5 % (ref 0.0–3.0)
Eosinophils Absolute: 0.2 10*3/uL (ref 0.0–0.7)
Lymphocytes Relative: 43.2 % (ref 12.0–46.0)
MCHC: 34.8 g/dL (ref 30.0–36.0)
Neutrophils Relative %: 43.5 % (ref 43.0–77.0)
RBC: 4.26 Mil/uL (ref 3.87–5.11)
RDW: 13.6 % (ref 11.5–14.6)

## 2012-08-10 LAB — TSH: TSH: 2.79 u[IU]/mL (ref 0.35–5.50)

## 2012-08-10 NOTE — Telephone Encounter (Signed)
Message copied by Judy Pimple on Tue Aug 10, 2012 10:41 AM ------      Message from: Alvina Chou      Created: Fri Jul 23, 2012 11:32 AM      Regarding: lab orders for Tuesday, 5.27.14       Patient is scheduled for CPX labs, please order future labs, Thanks , Terri       ------

## 2012-08-11 LAB — VITAMIN D 25 HYDROXY (VIT D DEFICIENCY, FRACTURES): Vit D, 25-Hydroxy: 57 ng/mL (ref 30–89)

## 2012-08-16 ENCOUNTER — Other Ambulatory Visit (HOSPITAL_COMMUNITY)
Admission: RE | Admit: 2012-08-16 | Discharge: 2012-08-16 | Disposition: A | Discharge status: Home / Self Care | Payer: No Typology Code available for payment source | Source: Ambulatory Visit | Attending: Family Medicine | Admitting: Family Medicine

## 2012-08-16 ENCOUNTER — Ambulatory Visit (INDEPENDENT_AMBULATORY_CARE_PROVIDER_SITE_OTHER): Payer: No Typology Code available for payment source | Admitting: Family Medicine

## 2012-08-16 ENCOUNTER — Encounter: Payer: Self-pay | Admitting: Family Medicine

## 2012-08-16 VITALS — BP 108/80 | HR 55 | Temp 98.3°F | Ht 62.75 in | Wt 156.5 lb

## 2012-08-16 DIAGNOSIS — Z01419 Encounter for gynecological examination (general) (routine) without abnormal findings: Secondary | ICD-10-CM | POA: Insufficient documentation

## 2012-08-16 DIAGNOSIS — M949 Disorder of cartilage, unspecified: Secondary | ICD-10-CM

## 2012-08-16 DIAGNOSIS — Z0000 Encounter for general adult medical examination without abnormal findings: Secondary | ICD-10-CM

## 2012-08-16 DIAGNOSIS — E559 Vitamin D deficiency, unspecified: Secondary | ICD-10-CM

## 2012-08-16 DIAGNOSIS — M899 Disorder of bone, unspecified: Secondary | ICD-10-CM

## 2012-08-16 DIAGNOSIS — E7800 Pure hypercholesterolemia, unspecified: Secondary | ICD-10-CM

## 2012-08-16 MED ORDER — ALENDRONATE SODIUM 70 MG PO TABS: 70.0000 mg | ORAL_TABLET | ORAL | Status: DC

## 2012-08-16 MED ORDER — FLUTICASONE PROPIONATE 50 MCG/ACT NA SUSP: 2.0000 | Freq: Every day | NASAL | Status: DC

## 2012-08-16 MED ORDER — BUTALBITAL-APAP-CAFFEINE 50-500-40 MG PO CAPS: ORAL_CAPSULE | ORAL | Status: DC

## 2012-08-16 NOTE — Assessment & Plan Note (Signed)
Due for dexa Will schedule that  No falls or fx Disc safety Nl D level

## 2012-08-16 NOTE — Assessment & Plan Note (Signed)
Disc goals for lipids and reasons to control them Rev labs with pt Rev low sat fat diet in detail Overall stable with high HDL   

## 2012-08-16 NOTE — Progress Notes (Signed)
Subjective:    Patient ID: Kelsey Haley, female    DOB: 05/30/1945, 67 y.o.   MRN: [id number]  HPI Here for health maintenance exam and to review chronic medical problems    Overall she feels good  Nothing new   Wt is up 1 lb with bmi of 27  Zoster vaccine- is interested in that if covered   Flu shot- had this season   mammo 3/13- was ok - and will make her own appt at [facility] Self exam- is normal   Pap was 7/11-that was normal  No female problems or symptoms at all  No hx of abnormal pap smears  Will do pap today  colonosc 1/12 with 5 year recall for polyp No bowel changes   Td 3/13  PTX 3/13  D level is 57 Due for 2 year dexa  Fx/ falls-none at all   Mood- good   Lipids Lab Results  Component Value Date   CHOL 238* 08/10/2012   CHOL 241* 06/02/2011   CHOL 243* 01/08/2010   Lab Results  Component Value Date   HDL 78.20 08/10/2012   HDL 40.98 06/02/2011   HDL 11.91 01/08/2010   No results found for this basename: [facility]   Lab Results  Component Value Date   TRIG 67.0 08/10/2012   TRIG 47.0 06/02/2011   TRIG 57.0 01/08/2010   Lab Results  Component Value Date   CHOLHDL 3 08/10/2012   CHOLHDL 3 06/02/2011   CHOLHDL 3 01/08/2010   Lab Results  Component Value Date   LDLDIRECT 143.0 08/10/2012   LDLDIRECT 142.9 06/02/2011   LDLDIRECT 133.3 01/08/2010   eats very healthy and she watches this carefully Did gain some weight when traveling   Is back to exercising now   Patient Active Problem List   Diagnosis Date Noted  . Encounter for routine gynecological examination 08/16/2012  . Routine general medical examination at a health care facility 08/10/2012  . Other screening mammogram 06/02/2011  . UNSPECIFIED VITAMIN D DEFICIENCY 01/08/2010  . HYPERCHOLESTEROLEMIA 01/08/2010  . COLONIC POLYPS 10/01/2009  . UNSPECIFIED HEARING LOSS 11/22/2008  . INTERMITTENT VERTIGO 11/22/2008  . OSTEOPENIA 01/27/2007   Past Medical History  Diagnosis Date  .  Vertigo   . Hearing loss   . Osteopenia    No past surgical history on file. History  Substance Use Topics  . Smoking status: Former Smoker    Quit date: 03/17/1994  . Smokeless tobacco: Not on file  . Alcohol Use: Yes     Comment: occ-wine   No family history on file. No Known Allergies Current Outpatient Prescriptions on File Prior to Visit  Medication Sig Dispense Refill  . Ascorbic Acid (VITAMIN C) 1000 MG tablet Take 1,000 mg by mouth daily.      . butalbital-acetaminophen-caffeine (ESGIC) 50-325-40 MG per tablet TAKE 1 CAPSULE BY MOUTH EVERY 4 TO 6 HOURS AS NEEDED FOR HEADACHE      . Calcium Carbonate-Vit D-Min 600-400 MG-UNIT TABS Take 1 tablet by mouth 2 (two) times daily.      . meclizine (ANTIVERT) 25 MG tablet 1-2 tablets by mouth every 8 hours as needed for dizziness and vertigo.  30 tablet  0  . TRAVEL SICKNESS 25 MG CHEW CHEW AND SWALLOW 1 TO 2 TABLETS BY MOUTH EVERY 8 HOURS AS NEEDED FOR DIZZINESS AND VERTIGO  30 tablet  3   No current facility-administered medications on file prior to visit.    Review of Systems Review of Systems  Constitutional: Negative for fever, appetite change, fatigue and unexpected weight change.  Eyes: Negative for pain and visual disturbance.  Respiratory: Negative for cough and shortness of breath.   Cardiovascular: Negative for cp or palpitations    Gastrointestinal: Negative for nausea, diarrhea and constipation.  Genitourinary: Negative for urgency and frequency.  Skin: Negative for pallor or rash   Neurological: Negative for weakness, light-headedness, numbness and headaches.  Hematological: Negative for adenopathy. Does not bruise/bleed easily.  Psychiatric/Behavioral: Negative for dysphoric mood. The patient is not nervous/anxious.         Objective:   Physical Exam  Constitutional: She appears well-developed and well-nourished. No distress.  HENT:  Head: Normocephalic and atraumatic.  Right Ear: External ear normal.  Left  Ear: External ear normal.  Nose: Nose normal.  Mouth/Throat: Oropharynx is clear and moist. No oropharyngeal exudate.  Eyes: Conjunctivae and EOM are normal. Pupils are equal, round, and reactive to light. Right eye exhibits no discharge. Left eye exhibits no discharge. No scleral icterus.  Neck: Normal range of motion. Neck supple. No JVD present. Carotid bruit is not present. No thyromegaly present.  Cardiovascular: Normal rate, regular rhythm, normal heart sounds and intact distal pulses.  Exam reveals no gallop.   Pulmonary/Chest: Effort normal and breath sounds normal. No respiratory distress. She has no wheezes.  Abdominal: Soft. Bowel sounds are normal. She exhibits no distension, no abdominal bruit and no mass. There is no tenderness.  Genitourinary: Vagina normal and uterus normal. No breast swelling, tenderness or bleeding. There is no rash, tenderness or lesion on the right labia. There is no rash, tenderness or lesion on the left labia. Uterus is not enlarged and not tender. Cervix exhibits no motion tenderness, no discharge and no friability. Right adnexum displays no mass, no tenderness and no fullness. Left adnexum displays no mass and no tenderness. No vaginal discharge found.  Breast exam: No mass, nodules, thickening, tenderness, bulging, retraction, inflamation, nipple discharge or skin changes noted.  No axillary or clavicular LA.  Chaperoned exam.    Musculoskeletal: She exhibits no edema and no tenderness.  Lymphadenopathy:    She has no cervical adenopathy.  Neurological: She is alert. She has normal reflexes. No cranial nerve deficit. She exhibits normal muscle tone. Coordination normal.  Skin: Skin is warm and dry. No rash noted. No erythema. No pallor.  Psychiatric: She has a normal mood and affect.          Assessment & Plan:

## 2012-08-16 NOTE — Assessment & Plan Note (Signed)
Reviewed health habits including diet and exercise and skin cancer prevention Also reviewed health mt list, fam hx and immunizations   Labs reviewed in detail

## 2012-08-16 NOTE — Patient Instructions (Addendum)
Don't forget to call and make your mammogram appointment If you are interested in a shingles/zoster vaccine - call your insurance to check on coverage,( you should not get it within 1 month of other vaccines) , then call us for a prescription  for it to take to a pharmacy that gives the shot , or make a nurse visit to get it here depending on your coverage Stop up front and talk to [facility] on the way out about your bone density test   take good care of yourself

## 2012-08-16 NOTE — Assessment & Plan Note (Signed)
D level ok in 50s on current dose Disc imp to bone and overall health

## 2012-08-16 NOTE — Assessment & Plan Note (Signed)
Exam with pap done  If normal - can likely stop pap smears  No symptoms or complaints

## 2012-08-19 ENCOUNTER — Encounter: Payer: Self-pay | Admitting: *Deleted

## 2012-08-19 ENCOUNTER — Encounter: Payer: Self-pay | Admitting: Family Medicine

## 2012-09-22 ENCOUNTER — Ambulatory Visit: Payer: Self-pay | Admitting: Family Medicine

## 2012-09-22 LAB — HM DEXA SCAN

## 2012-09-23 ENCOUNTER — Encounter: Payer: Self-pay | Admitting: Family Medicine

## 2012-09-24 ENCOUNTER — Encounter: Payer: Self-pay | Admitting: *Deleted

## 2012-09-27 ENCOUNTER — Encounter: Payer: Self-pay | Admitting: Family Medicine

## 2012-09-28 ENCOUNTER — Encounter: Payer: Self-pay | Admitting: *Deleted

## 2012-09-28 ENCOUNTER — Encounter: Payer: Self-pay | Admitting: Family Medicine

## 2012-10-20 ENCOUNTER — Other Ambulatory Visit: Payer: Self-pay | Admitting: *Deleted

## 2012-10-20 NOTE — Telephone Encounter (Signed)
Fax refill request, please advise  

## 2012-10-20 NOTE — Telephone Encounter (Signed)
Please refill times 1 

## 2012-10-21 MED ORDER — MECLIZINE HCL 25 MG PO CHEW: CHEWABLE_TABLET | ORAL | Status: DC

## 2012-10-21 NOTE — Telephone Encounter (Signed)
done

## 2012-10-25 ENCOUNTER — Telehealth: Payer: Self-pay

## 2012-10-25 NOTE — Telephone Encounter (Signed)
That is fine with me just have them to advise her not to mix that with additional acetaminophen

## 2012-10-25 NOTE — Telephone Encounter (Signed)
[facility] from [place] called and request change Zebutal to Fioricet; Pharmacist said Zebutal has 500 mg Tylenol and Fioricet has 325 mg Tylenol.Please advise.

## 2012-10-25 NOTE — Telephone Encounter (Signed)
Pharmacist notified.

## 2012-12-14 ENCOUNTER — Other Ambulatory Visit: Payer: Self-pay | Admitting: Family Medicine

## 2012-12-15 NOTE — Telephone Encounter (Signed)
Electronic refill request, please advise  

## 2012-12-15 NOTE — Telephone Encounter (Signed)
Please refill times one  

## 2013-08-06 ENCOUNTER — Other Ambulatory Visit: Payer: Self-pay | Admitting: Family Medicine

## 2013-08-09 NOTE — Telephone Encounter (Signed)
[place] request refill on alendronate; advised to contact [place] in  to transfer refill; pharmacist voiced understanding.

## 2013-09-05 ENCOUNTER — Other Ambulatory Visit: Payer: Self-pay | Admitting: Family Medicine

## 2013-09-05 NOTE — Telephone Encounter (Signed)
Electronic refill request, Dr. Tower out of office, please advise  

## 2013-11-01 ENCOUNTER — Telehealth: Payer: Self-pay | Admitting: Family Medicine

## 2013-11-01 DIAGNOSIS — E7800 Pure hypercholesterolemia, unspecified: Secondary | ICD-10-CM

## 2013-11-01 DIAGNOSIS — M899 Disorder of bone, unspecified: Secondary | ICD-10-CM

## 2013-11-01 DIAGNOSIS — M949 Disorder of cartilage, unspecified: Principal | ICD-10-CM

## 2013-11-01 DIAGNOSIS — Z0000 Encounter for general adult medical examination without abnormal findings: Secondary | ICD-10-CM

## 2013-11-01 DIAGNOSIS — E559 Vitamin D deficiency, unspecified: Secondary | ICD-10-CM

## 2013-11-01 NOTE — Telephone Encounter (Signed)
Message copied by Abner Greenspan on Tue Nov 01, 2013  8:42 PM ------      Message from: Ellamae Sia      Created: Fri Oct 28, 2013  4:02 PM      Regarding: Lab orders for Wednesday, 8.19.15       Patient is scheduled for CPX labs, please order future labs, Thanks , Terri       ------

## 2013-11-02 ENCOUNTER — Other Ambulatory Visit (INDEPENDENT_AMBULATORY_CARE_PROVIDER_SITE_OTHER): Payer: No Typology Code available for payment source

## 2013-11-02 DIAGNOSIS — M949 Disorder of cartilage, unspecified: Secondary | ICD-10-CM

## 2013-11-02 DIAGNOSIS — Z0000 Encounter for general adult medical examination without abnormal findings: Secondary | ICD-10-CM

## 2013-11-02 DIAGNOSIS — E7800 Pure hypercholesterolemia, unspecified: Secondary | ICD-10-CM

## 2013-11-02 DIAGNOSIS — M899 Disorder of bone, unspecified: Secondary | ICD-10-CM

## 2013-11-02 DIAGNOSIS — E559 Vitamin D deficiency, unspecified: Secondary | ICD-10-CM

## 2013-11-02 LAB — COMPREHENSIVE METABOLIC PANEL
ALBUMIN: 3.9 g/dL (ref 3.5–5.2)
ALT: 13 U/L (ref 0–35)
AST: 18 U/L (ref 0–37)
Alkaline Phosphatase: 38 U/L — ABNORMAL LOW (ref 39–117)
BILIRUBIN TOTAL: 0.8 mg/dL (ref 0.2–1.2)
BUN: 18 mg/dL (ref 6–23)
CALCIUM: 9 mg/dL (ref 8.4–10.5)
CHLORIDE: 104 meq/L (ref 96–112)
CO2: 28 mEq/L (ref 19–32)
Creatinine, Ser: 0.7 mg/dL (ref 0.4–1.2)
GFR: 94.76 mL/min (ref >60.00)
Glucose, Bld: 84 mg/dL (ref 70–99)
POTASSIUM: 4 meq/L (ref 3.5–5.1)
SODIUM: 138 meq/L (ref 135–145)
Total Protein: 7.1 g/dL (ref 6.0–8.3)

## 2013-11-02 LAB — LIPID PANEL
Cholesterol: 223 mg/dL — ABNORMAL HIGH (ref 0–200)
HDL: 73.8 mg/dL (ref >39.00)
LDL CALC: 139 mg/dL — AB (ref 0–99)
NONHDL: 149.2
Total CHOL/HDL Ratio: 3
Triglycerides: 49 mg/dL (ref 0.0–149.0)
VLDL: 9.8 mg/dL (ref 0.0–40.0)

## 2013-11-02 LAB — CBC WITH DIFFERENTIAL/PLATELET
BASOS ABS: 0 10*3/uL (ref 0.0–0.1)
Basophils Relative: 0.4 % (ref 0.0–3.0)
EOS PCT: 3.2 % (ref 0.0–5.0)
Eosinophils Absolute: 0.2 10*3/uL (ref 0.0–0.7)
HCT: 37.7 % (ref 36.0–46.0)
Hemoglobin: 13.6 g/dL (ref 12.0–15.0)
LYMPHS ABS: 2 10*3/uL (ref 0.7–4.0)
LYMPHS PCT: 39.7 % (ref 12.0–46.0)
MCHC: 35.9 g/dL (ref 30.0–36.0)
MCV: 96.1 fl (ref 78.0–100.0)
MONOS PCT: 8.7 % (ref 3.0–12.0)
Monocytes Absolute: 0.4 10*3/uL (ref 0.1–1.0)
Neutro Abs: 2.4 10*3/uL (ref 1.4–7.7)
Neutrophils Relative %: 48 % (ref 43.0–77.0)
PLATELETS: 253 10*3/uL (ref 150.0–400.0)
RBC: 3.93 Mil/uL (ref 3.87–5.11)
RDW: 13.7 % (ref 11.5–15.5)
WBC: 4.9 10*3/uL (ref 4.0–10.5)

## 2013-11-02 LAB — VITAMIN D 25 HYDROXY (VIT D DEFICIENCY, FRACTURES): VITD: 55.07 ng/mL (ref 30.00–100.00)

## 2013-11-02 LAB — TSH: TSH: 0.99 u[IU]/mL (ref 0.35–4.50)

## 2013-11-09 ENCOUNTER — Encounter (INDEPENDENT_AMBULATORY_CARE_PROVIDER_SITE_OTHER): Payer: Self-pay

## 2013-11-09 ENCOUNTER — Encounter: Payer: Self-pay | Admitting: Family Medicine

## 2013-11-09 ENCOUNTER — Ambulatory Visit (INDEPENDENT_AMBULATORY_CARE_PROVIDER_SITE_OTHER): Payer: No Typology Code available for payment source | Admitting: Family Medicine

## 2013-11-09 VITALS — BP 118/88 | HR 65 | Temp 98.1°F | Ht 62.75 in | Wt 147.0 lb

## 2013-11-09 DIAGNOSIS — M949 Disorder of cartilage, unspecified: Secondary | ICD-10-CM

## 2013-11-09 DIAGNOSIS — Z23 Encounter for immunization: Secondary | ICD-10-CM

## 2013-11-09 DIAGNOSIS — D126 Benign neoplasm of colon, unspecified: Secondary | ICD-10-CM

## 2013-11-09 DIAGNOSIS — Z0000 Encounter for general adult medical examination without abnormal findings: Secondary | ICD-10-CM

## 2013-11-09 DIAGNOSIS — E559 Vitamin D deficiency, unspecified: Secondary | ICD-10-CM

## 2013-11-09 DIAGNOSIS — Z01419 Encounter for gynecological examination (general) (routine) without abnormal findings: Secondary | ICD-10-CM

## 2013-11-09 DIAGNOSIS — M899 Disorder of bone, unspecified: Secondary | ICD-10-CM

## 2013-11-09 DIAGNOSIS — E7800 Pure hypercholesterolemia, unspecified: Secondary | ICD-10-CM

## 2013-11-09 MED ORDER — ALENDRONATE SODIUM 70 MG PO TABS: ORAL_TABLET | ORAL | Status: DC

## 2013-11-09 MED ORDER — FLUTICASONE PROPIONATE 50 MCG/ACT NA SUSP: 2.0000 | Freq: Every day | NASAL | Status: DC

## 2013-11-09 NOTE — Patient Instructions (Signed)
Don't forget to schedule your annual mammogram  Flu vaccine today prevnar vaccine today Keep up the good work with healthy diet and exercise

## 2013-11-09 NOTE — Progress Notes (Signed)
Pre visit review using our clinic review tool, if applicable. No additional management support is needed unless otherwise documented below in the visit note. 

## 2013-11-09 NOTE — Assessment & Plan Note (Signed)
dexa 7/14 No falls or fx On fosamax Disc need for calcium/ vitamin D/ wt bearing exercise and bone density test every 2 y to monitor Disc safety/ fracture risk in detail   D level is 55

## 2013-11-09 NOTE — Progress Notes (Signed)
Subjective:    Patient ID: Kelsey Haley, female    DOB: 04-27-1945, 68 y.o.   MRN: [id number]  HPI Here for health maintenance exam and to review chronic medical problems    Wt is down 9 lb here  Intentional wt loss  She feels good  Eating healthy  Exercises - bike and walk  bmi is 26  Some pain in R knee -medial  Has known meniscus problems  Bike helps that   Zoster vaccine 10/14 at [facility]   Will schedule her own mammogram at [place] exam- no lumps or changes   Wants flu shot today Pneumovax 3/13 , wants prevnar today  Td 3/13 up to date  Colonoscopy 2012 - 5 year recall due to polyps   Dexa 7/14  Osteopenia  No falls or fx  Takes ca and D  D level 55 - good (low in the past)   Hyperlipidemia Lab Results  Component Value Date   CHOL 223* 11/02/2013   CHOL 238* 08/10/2012   CHOL 241* 06/02/2011   Lab Results  Component Value Date   HDL 73.80 11/02/2013   HDL 78.20 08/10/2012   HDL 77.10 06/02/2011   Lab Results  Component Value Date   LDLCALC 139* 11/02/2013   Lab Results  Component Value Date   TRIG 49.0 11/02/2013   TRIG 67.0 08/10/2012   TRIG 47.0 06/02/2011   Lab Results  Component Value Date   CHOLHDL 3 11/02/2013   CHOLHDL 3 08/10/2012   CHOLHDL 3 06/02/2011   Lab Results  Component Value Date   LDLDIRECT 143.0 08/10/2012   LDLDIRECT 142.9 06/02/2011   LDLDIRECT 133.3 01/08/2010   little improved LDL  Is pretty close to goal  She works on low sat fat diet   Rest of labs wnl   Mood is good   Patient Active Problem List   Diagnosis Date Noted  . Encounter for routine gynecological examination 08/16/2012  . Routine general medical examination at a health care facility 08/10/2012  . Other screening mammogram 06/02/2011  . UNSPECIFIED VITAMIN D DEFICIENCY 01/08/2010  . HYPERCHOLESTEROLEMIA 01/08/2010  . COLONIC POLYPS 10/01/2009  . UNSPECIFIED HEARING LOSS 11/22/2008  . INTERMITTENT VERTIGO 11/22/2008  . OSTEOPENIA 01/27/2007    Past Medical History  Diagnosis Date  . Vertigo   . Hearing loss   . Osteopenia    No past surgical history on file. History  Substance Use Topics  . Smoking status: Former Smoker    Quit date: 03/17/1994  . Smokeless tobacco: Not on file  . Alcohol Use: Yes     Comment: occ-wine   No family history on file. No Known Allergies Current Outpatient Prescriptions on File Prior to Visit  Medication Sig Dispense Refill  . ACETAMINOPHEN-CAFF-BUTALBITAL (ZEBUTAL) 50-500-40 MG CAPS TAKE 1 CAPSULE BY MOUTH EVERY 4 TO 6 HOURS AS NEEDED FOR HEADACHE  30 capsule  3  . alendronate (FOSAMAX) 70 MG tablet TAKE 1 TABLET BY MOUTH ONCE A WEEK ON AN EMPTY STOMACH WITH A FULL GLASS OF WATER  4 tablet  2  . Ascorbic Acid (VITAMIN C) 1000 MG tablet Take 1,000 mg by mouth daily.      . butalbital-acetaminophen-caffeine (ESGIC) 50-325-40 MG per tablet TAKE 1 CAPSULE BY MOUTH EVERY 4 TO 6 HOURS AS NEEDED FOR HEADACHE      . Calcium Carbonate-Vit D-Min 600-400 MG-UNIT TABS Take 1 tablet by mouth 2 (two) times daily.      . Cholecalciferol (VITAMIN D3)  2000 UNITS TABS Take 1 tablet by mouth daily.      . COLLAGEN PO Take 1 tablet by mouth daily.      . fluticasone (FLONASE) 50 MCG/ACT nasal spray Place 2 sprays into the nose daily.  16 g  11  . Garlic 0938 MG CAPS Take 1 capsule by mouth daily.      . Ginkgo Biloba 40 MG TABS Take 1 tablet by mouth daily.      Marland Kitchen GRAPE SEED CR PO Take 1 tablet by mouth daily. With resveratrol      . NON FORMULARY Take 1 tablet by mouth daily. HOMEGA 3-6-9      . Red Yeast Rice 600 MG CAPS Take 1,200 mg by mouth daily.      . TRAVEL SICKNESS 25 MG CHEW CHEW AND SWALLOW 1 TO 2 TABLETS BY MOUTH EVERY 8 HOURS AS NEEDED FOR DIZZINESS AND VERTIGO  30 tablet  0   No current facility-administered medications on file prior to visit.      Review of Systems Review of Systems  Constitutional: Negative for fever, appetite change, fatigue and unexpected weight change.  Eyes:  Negative for pain and visual disturbance.  Respiratory: Negative for cough and shortness of breath.   Cardiovascular: Negative for cp or palpitations    Gastrointestinal: Negative for nausea, diarrhea and constipation.  Genitourinary: Negative for urgency and frequency.  Skin: Negative for pallor or rash   Neurological: Negative for weakness, light-headedness, numbness and headaches.  Hematological: Negative for adenopathy. Does not bruise/bleed easily.  Psychiatric/Behavioral: Negative for dysphoric mood. The patient is not nervous/anxious.         Objective:   Physical Exam  Constitutional: She appears well-developed and well-nourished. No distress.  HENT:  Head: Normocephalic and atraumatic.  Right Ear: External ear normal.  Left Ear: External ear normal.  Nose: Nose normal.  Mouth/Throat: Oropharynx is clear and moist.  Eyes: Conjunctivae and EOM are normal. Pupils are equal, round, and reactive to light. Right eye exhibits no discharge. Left eye exhibits no discharge. No scleral icterus.  Neck: Normal range of motion. Neck supple. No JVD present. No thyromegaly present.  Cardiovascular: Normal rate, regular rhythm, normal heart sounds and intact distal pulses.  Exam reveals no gallop.   Pulmonary/Chest: Effort normal and breath sounds normal. No respiratory distress. She has no wheezes. She has no rales.  Abdominal: Soft. Bowel sounds are normal. She exhibits no distension and no mass. There is no tenderness.  Genitourinary: No breast swelling, tenderness, discharge or bleeding.  Breast exam: No mass, nodules, thickening, tenderness, bulging, retraction, inflamation, nipple discharge or skin changes noted.  No axillary or clavicular LA.      Musculoskeletal: She exhibits no edema and no tenderness.  Lymphadenopathy:    She has no cervical adenopathy.  Neurological: She is alert. She has normal reflexes. No cranial nerve deficit. She exhibits normal muscle tone. Coordination  normal.  Skin: Skin is warm and dry. No rash noted. No erythema. No pallor.  Psychiatric: She has a normal mood and affect.          Assessment & Plan:   Problem List Items Addressed This Visit     Digestive   COLONIC POLYPS - Primary     colonsc 2012 with 5 year recall  No symptoms       Musculoskeletal and Integument   OSTEOPENIA     dexa 7/14 No falls or fx On fosamax Disc need for calcium/ vitamin D/ wt  bearing exercise and bone density test every 2 y to monitor Disc safety/ fracture risk in detail   D level is 55       Other   UNSPECIFIED VITAMIN D DEFICIENCY     Vitamin D level is therapeutic with current supplementation Disc importance of this to bone and overall health     HYPERCHOLESTEROLEMIA     Disc goals for lipids and reasons to control them Rev labs with pt Rev low sat fat diet in detail Good HDL Continue to monitor Goal LDL under 130    Routine general medical examination at a health care facility     Reviewed health habits including diet and exercise and skin cancer prevention Reviewed appropriate screening tests for age  Also reviewed health mt list, fam hx and immunization status , as well as social and family history   See HPI Urged to keep up good diet and exercise Labs rev  She will schedule own mammogram  Flu and prevnar vaccines today    Encounter for routine gynecological examination    Other Visit Diagnoses   Need for prophylactic vaccination and inoculation against influenza        Relevant Orders       Flu Vaccine QUAD 36+ mos PF IM (Fluarix Quad PF) (Completed)    Need for vaccination with 13-polyvalent pneumococcal conjugate vaccine        Relevant Orders       Pneumococcal conjugate vaccine 13-valent (Completed)

## 2013-11-09 NOTE — Assessment & Plan Note (Signed)
Reviewed health habits including diet and exercise and skin cancer prevention Reviewed appropriate screening tests for age  Also reviewed health mt list, fam hx and immunization status , as well as social and family history   See HPI Urged to keep up good diet and exercise Labs rev  She will schedule own mammogram  Flu and prevnar vaccines today

## 2013-11-09 NOTE — Assessment & Plan Note (Signed)
Disc goals for lipids and reasons to control them Rev labs with pt Rev low sat fat diet in detail Good HDL Continue to monitor Goal LDL under 130

## 2013-11-09 NOTE — Assessment & Plan Note (Signed)
Vitamin D level is therapeutic with current supplementation Disc importance of this to bone and overall health  

## 2013-11-09 NOTE — Assessment & Plan Note (Signed)
colonsc 2012 with 5 year recall  No symptoms

## 2013-11-14 ENCOUNTER — Other Ambulatory Visit: Payer: Self-pay | Admitting: Family Medicine

## 2013-11-14 NOTE — Telephone Encounter (Signed)
Ok to refill 

## 2013-11-14 NOTE — Telephone Encounter (Signed)
Please refill times 3 

## 2013-11-15 NOTE — Telephone Encounter (Signed)
done

## 2013-12-12 ENCOUNTER — Other Ambulatory Visit: Payer: Self-pay | Admitting: *Deleted

## 2013-12-12 MED ORDER — BUTALBITAL-APAP-CAFFEINE 50-325-40 MG PO TABS: ORAL_TABLET | ORAL | Status: DC

## 2013-12-12 NOTE — Telephone Encounter (Signed)
Rx called in as prescribed 

## 2013-12-12 NOTE — Telephone Encounter (Signed)
Fax refill request, please advise  

## 2013-12-12 NOTE — Telephone Encounter (Signed)
Px written for call in   

## 2014-01-06 ENCOUNTER — Ambulatory Visit: Payer: Self-pay | Admitting: Family Medicine

## 2014-01-06 ENCOUNTER — Encounter: Payer: Self-pay | Admitting: Family Medicine

## 2014-01-09 ENCOUNTER — Encounter: Payer: Self-pay | Admitting: *Deleted

## 2014-10-23 ENCOUNTER — Encounter: Payer: Self-pay | Admitting: Family Medicine

## 2014-10-23 ENCOUNTER — Ambulatory Visit (INDEPENDENT_AMBULATORY_CARE_PROVIDER_SITE_OTHER): Payer: PPO | Admitting: Family Medicine

## 2014-10-23 VITALS — BP 104/68 | HR 60 | Temp 98.3°F | Ht 62.75 in | Wt 147.2 lb

## 2014-10-23 DIAGNOSIS — T63441A Toxic effect of venom of bees, accidental (unintentional), initial encounter: Secondary | ICD-10-CM

## 2014-10-23 MED ORDER — MOMETASONE FUROATE 0.1 % EX CREA: 1.0000 "application " | TOPICAL_CREAM | Freq: Every day | CUTANEOUS | Status: DC

## 2014-10-23 NOTE — Patient Instructions (Signed)
You have an allergic reaction to a bee sting  Use the elocon cream once daily as needed  Also a cool compress  Continue the oral antihistamine for itch  Keep the sting area clean and dry with soap and water

## 2014-10-23 NOTE — Progress Notes (Signed)
Pre visit review using our clinic review tool, if applicable. No additional management support is needed unless otherwise documented below in the visit note. 

## 2014-10-23 NOTE — Assessment & Plan Note (Signed)
Local reaction on R torso- from honeybee that got caught in her clothes (brought the dead bee with her) Itchy - is taking claritin Px elocon cream to use daily  Disc care/keeping it clean  No s/s of anaphylaxis  Update if worse or no improvement

## 2014-10-23 NOTE — Progress Notes (Signed)
Subjective:    Patient ID: Kelsey Haley, female    DOB: 04/05/1945, 69 y.o.   MRN: [id number]  HPI Here with an insect bite/sting - thinks it was a bee   Had taken dog out  She went into the house - felt the sting and then the bee fell out of her shirt (honey bee)- brought it with her Very itchy  Used ivarest antihistamine cream Also baking soda and vinegar  Also claritin   No mouth or facial swelling   Patient Active Problem List   Diagnosis Date Noted  . Encounter for routine gynecological examination 08/16/2012  . Routine general medical examination at a health care facility 08/10/2012  . Other screening mammogram 06/02/2011  . UNSPECIFIED VITAMIN D DEFICIENCY 01/08/2010  . HYPERCHOLESTEROLEMIA 01/08/2010  . COLONIC POLYPS 10/01/2009  . UNSPECIFIED HEARING LOSS 11/22/2008  . INTERMITTENT VERTIGO 11/22/2008  . OSTEOPENIA 01/27/2007   Past Medical History  Diagnosis Date  . Vertigo   . Hearing loss   . Osteopenia    No past surgical history on file. History  Substance Use Topics  . Smoking status: Former Smoker    Quit date: 03/17/1994  . Smokeless tobacco: Not on file  . Alcohol Use: 0.0 oz/week    0 Standard drinks or equivalent per week     Comment: occ-wine   No family history on file. No Known Allergies Current Outpatient Prescriptions on File Prior to Visit  Medication Sig Dispense Refill  . alendronate (FOSAMAX) 70 MG tablet TAKE 1 TABLET BY MOUTH ONCE A WEEK ON AN EMPTY STOMACH WITH A FULL GLASS OF WATER 4 tablet 11  . Ascorbic Acid (VITAMIN C) 1000 MG tablet Take 1,000 mg by mouth daily.    . butalbital-acetaminophen-caffeine (ESGIC) 50-325-40 MG per tablet TAKE 1 CAPSULE BY MOUTH EVERY 4 TO 6 HOURS AS NEEDED FOR HEADACHE 30 tablet 3  . Calcium Carbonate-Vit D-Min 600-400 MG-UNIT TABS Take 1 tablet by mouth 2 (two) times daily.    . Cholecalciferol (VITAMIN D3) 2000 UNITS TABS Take 1 tablet by mouth daily.    . COLLAGEN PO Take 1 tablet by mouth  daily.    . fluticasone (FLONASE) 50 MCG/ACT nasal spray Place 2 sprays into both nostrils daily. 16 g 11  . Garlic 8413 MG CAPS Take 1 capsule by mouth daily.    . Ginkgo Biloba 40 MG TABS Take 1 tablet by mouth daily.    Marland Kitchen GRAPE SEED CR PO Take 1 tablet by mouth daily. With resveratrol    . Melatonin 3 MG CAPS Take 1 capsule by mouth at bedtime as needed.    . NON FORMULARY Take 1 tablet by mouth daily. HOMEGA 3-6-9    . Red Yeast Rice 600 MG CAPS Take 1,200 mg by mouth daily.    . TRAVEL SICKNESS 25 MG CHEW CHEW AND SWALLOW 1 TABLET TO 2 TABLETS BY MOUTH EVERY 8 HOURS AS NEEDED FOR DIZZINESS AND VERTIGO 30 tablet 2   No current facility-administered medications on file prior to visit.      Review of Systems Review of Systems  Constitutional: Negative for fever, appetite change, fatigue and unexpected weight change.  Eyes: Negative for pain and visual disturbance.  Respiratory: Negative for cough and shortness of breath.  neg for wheezing  Cardiovascular: Negative for cp or palpitations    Gastrointestinal: Negative for nausea, diarrhea and constipation.  Genitourinary: Negative for urgency and frequency.  Skin: Negative for pallor or rash  pos for  itchy insect sting  Neurological: Negative for weakness, light-headedness, numbness and headaches.  Hematological: Negative for adenopathy. Does not bruise/bleed easily.  Psychiatric/Behavioral: Negative for dysphoric mood. The patient is not nervous/anxious.         Objective:   Physical Exam  Constitutional: She appears well-developed and well-nourished. No distress.  HENT:  Head: Normocephalic and atraumatic.  No lip or mouth swelling   Eyes: Conjunctivae and EOM are normal. Pupils are equal, round, and reactive to light. Right eye exhibits no discharge. Left eye exhibits no discharge.  Neck: Normal range of motion. Neck supple.  Cardiovascular: Normal rate and regular rhythm.   Pulmonary/Chest: Effort normal and breath sounds  normal. She has no wheezes.  Lymphadenopathy:    She has no cervical adenopathy.  Neurological: She is alert.  Skin: Skin is warm and dry. She is not diaphoretic. There is erythema.  Insect sting over R torso - 3-4 cm of erythema and induration with few excoriations     Psychiatric: She has a normal mood and affect.          Assessment & Plan:   Problem List Items Addressed This Visit    Bee sting reaction - Primary    Local reaction on R torso- from honeybee that got caught in her clothes (brought the dead bee with her) Itchy - is taking claritin Px elocon cream to use daily  Disc care/keeping it clean  No s/s of anaphylaxis  Update if worse or no improvement

## 2014-11-03 ENCOUNTER — Other Ambulatory Visit: Payer: Self-pay | Admitting: Family Medicine

## 2014-11-13 ENCOUNTER — Telehealth: Payer: Self-pay | Admitting: Family Medicine

## 2014-11-13 DIAGNOSIS — Z0000 Encounter for general adult medical examination without abnormal findings: Secondary | ICD-10-CM

## 2014-11-13 DIAGNOSIS — E559 Vitamin D deficiency, unspecified: Secondary | ICD-10-CM

## 2014-11-13 DIAGNOSIS — E7800 Pure hypercholesterolemia, unspecified: Secondary | ICD-10-CM

## 2014-11-13 NOTE — Telephone Encounter (Signed)
-----   Message from Marchia Bond sent at 11/10/2014  2:32 PM EDT ----- Regarding: Cpx labs 8/31, need orders please :-) Please order  future cpx labs for pt's upcoming lab appt. Thanks Aniceto Boss

## 2014-11-15 ENCOUNTER — Other Ambulatory Visit (INDEPENDENT_AMBULATORY_CARE_PROVIDER_SITE_OTHER): Payer: PPO

## 2014-11-15 DIAGNOSIS — Z0000 Encounter for general adult medical examination without abnormal findings: Secondary | ICD-10-CM | POA: Diagnosis not present

## 2014-11-15 DIAGNOSIS — E7800 Pure hypercholesterolemia, unspecified: Secondary | ICD-10-CM

## 2014-11-15 DIAGNOSIS — E559 Vitamin D deficiency, unspecified: Secondary | ICD-10-CM

## 2014-11-15 LAB — LIPID PANEL
CHOL/HDL RATIO: 3
CHOLESTEROL: 217 mg/dL — AB (ref 0–200)
HDL: 74.8 mg/dL (ref >39.00)
LDL CALC: 130 mg/dL — AB (ref 0–99)
NONHDL: 141.79
Triglycerides: 58 mg/dL (ref 0.0–149.0)
VLDL: 11.6 mg/dL (ref 0.0–40.0)

## 2014-11-15 LAB — COMPREHENSIVE METABOLIC PANEL
ALT: 15 U/L (ref 0–35)
AST: 22 U/L (ref 0–37)
Albumin: 4.2 g/dL (ref 3.5–5.2)
Alkaline Phosphatase: 37 U/L — ABNORMAL LOW (ref 39–117)
BUN: 15 mg/dL (ref 6–23)
CHLORIDE: 103 meq/L (ref 96–112)
CO2: 29 meq/L (ref 19–32)
CREATININE: 0.7 mg/dL (ref 0.40–1.20)
Calcium: 9.5 mg/dL (ref 8.4–10.5)
GFR: 88.26 mL/min (ref >60.00)
GLUCOSE: 89 mg/dL (ref 70–99)
POTASSIUM: 3.9 meq/L (ref 3.5–5.1)
SODIUM: 138 meq/L (ref 135–145)
Total Bilirubin: 0.7 mg/dL (ref 0.2–1.2)
Total Protein: 7.1 g/dL (ref 6.0–8.3)

## 2014-11-15 LAB — CBC WITH DIFFERENTIAL/PLATELET
BASOS PCT: 0.5 % (ref 0.0–3.0)
Basophils Absolute: 0 10*3/uL (ref 0.0–0.1)
EOS ABS: 0.2 10*3/uL (ref 0.0–0.7)
EOS PCT: 4.8 % (ref 0.0–5.0)
HCT: 41.5 % (ref 36.0–46.0)
Hemoglobin: 14.1 g/dL (ref 12.0–15.0)
LYMPHS ABS: 2.1 10*3/uL (ref 0.7–4.0)
Lymphocytes Relative: 46.8 % — ABNORMAL HIGH (ref 12.0–46.0)
MCHC: 34 g/dL (ref 30.0–36.0)
MCV: 95.8 fl (ref 78.0–100.0)
MONO ABS: 0.4 10*3/uL (ref 0.1–1.0)
Monocytes Relative: 8.5 % (ref 3.0–12.0)
NEUTROS PCT: 39.4 % — AB (ref 43.0–77.0)
Neutro Abs: 1.8 10*3/uL (ref 1.4–7.7)
PLATELETS: 255 10*3/uL (ref 150.0–400.0)
RBC: 4.34 Mil/uL (ref 3.87–5.11)
RDW: 13.6 % (ref 11.5–15.5)
WBC: 4.6 10*3/uL (ref 4.0–10.5)

## 2014-11-15 LAB — TSH: TSH: 3.23 u[IU]/mL (ref 0.35–4.50)

## 2014-11-15 LAB — VITAMIN D 25 HYDROXY (VIT D DEFICIENCY, FRACTURES): VITD: 59.53 ng/mL (ref 30.00–100.00)

## 2014-11-22 ENCOUNTER — Encounter: Payer: No Typology Code available for payment source | Admitting: Family Medicine

## 2014-12-20 ENCOUNTER — Ambulatory Visit (INDEPENDENT_AMBULATORY_CARE_PROVIDER_SITE_OTHER): Payer: PPO | Admitting: Family Medicine

## 2014-12-20 ENCOUNTER — Ambulatory Visit: Payer: PPO | Admitting: Family Medicine

## 2014-12-20 ENCOUNTER — Encounter: Payer: Self-pay | Admitting: Family Medicine

## 2014-12-20 VITALS — BP 112/70 | HR 63 | Temp 98.0°F | Ht 62.75 in | Wt 144.2 lb

## 2014-12-20 DIAGNOSIS — D179 Benign lipomatous neoplasm, unspecified: Secondary | ICD-10-CM

## 2014-12-20 DIAGNOSIS — Z23 Encounter for immunization: Secondary | ICD-10-CM

## 2014-12-20 HISTORY — DX: Benign lipomatous neoplasm, unspecified: D17.9

## 2014-12-20 NOTE — Progress Notes (Signed)
Pre visit review using our clinic review tool, if applicable. No additional management support is needed unless otherwise documented below in the visit note. 

## 2014-12-20 NOTE — Progress Notes (Signed)
Subjective:    Patient ID: Kelsey Haley, female    DOB: Aug 07, 1945, 69 y.o.   MRN: [id number]  HPI Here with a lump on her back  No pain  Noticed it Monday  Last physical was planned and she could not come  Gave her next appt for March first   Lab on 11/15/2014  Component Date Value Ref Range Status  . WBC 11/15/2014 4.6  4.0 - 10.5 K/uL Final  . RBC 11/15/2014 4.34  3.87 - 5.11 Mil/uL Final  . Hemoglobin 11/15/2014 14.1  12.0 - 15.0 g/dL Final  . HCT 11/15/2014 41.5  36.0 - 46.0 % Final  . MCV 11/15/2014 95.8  78.0 - 100.0 fl Final  . MCHC 11/15/2014 34.0  30.0 - 36.0 g/dL Final  . RDW 11/15/2014 13.6  11.5 - 15.5 % Final  . Platelets 11/15/2014 255.0  150.0 - 400.0 K/uL Final  . Neutrophils Relative % 11/15/2014 39.4* 43.0 - 77.0 % Final  . Lymphocytes Relative 11/15/2014 46.8* 12.0 - 46.0 % Final  . Monocytes Relative 11/15/2014 8.5  3.0 - 12.0 % Final  . Eosinophils Relative 11/15/2014 4.8  0.0 - 5.0 % Final  . Basophils Relative 11/15/2014 0.5  0.0 - 3.0 % Final  . Neutro Abs 11/15/2014 1.8  1.4 - 7.7 K/uL Final  . Lymphs Abs 11/15/2014 2.1  0.7 - 4.0 K/uL Final  . Monocytes Absolute 11/15/2014 0.4  0.1 - 1.0 K/uL Final  . Eosinophils Absolute 11/15/2014 0.2  0.0 - 0.7 K/uL Final  . Basophils Absolute 11/15/2014 0.0  0.0 - 0.1 K/uL Final  . Sodium 11/15/2014 138  135 - 145 mEq/L Final  . Potassium 11/15/2014 3.9  3.5 - 5.1 mEq/L Final  . Chloride 11/15/2014 103  96 - 112 mEq/L Final  . CO2 11/15/2014 29  19 - 32 mEq/L Final  . Glucose, Bld 11/15/2014 89  70 - 99 mg/dL Final  . BUN 11/15/2014 15  6 - 23 mg/dL Final  . Creatinine, Ser 11/15/2014 0.70  0.40 - 1.20 mg/dL Final  . Total Bilirubin 11/15/2014 0.7  0.2 - 1.2 mg/dL Final  . Alkaline Phosphatase 11/15/2014 37* 39 - 117 U/L Final  . AST 11/15/2014 22  0 - 37 U/L Final  . ALT 11/15/2014 15  0 - 35 U/L Final  . Total Protein 11/15/2014 7.1  6.0 - 8.3 g/dL Final  . Albumin 11/15/2014 4.2  3.5 - 5.2 g/dL Final    . Calcium 11/15/2014 9.5  8.4 - 10.5 mg/dL Final  . GFR 11/15/2014 88.26  >60.00 mL/min Final  . Cholesterol 11/15/2014 217* 0 - 200 mg/dL Final   ATP III Classification       Desirable:  < 200 mg/dL               Borderline High:  200 - 239 mg/dL          High:  > = 240 mg/dL  . Triglycerides 11/15/2014 58.0  0.0 - 149.0 mg/dL Final   Normal:  <150 mg/dLBorderline High:  150 - 199 mg/dL  . HDL 11/15/2014 74.80  >39.00 mg/dL Final  . VLDL 11/15/2014 11.6  0.0 - 40.0 mg/dL Final  . LDL Cholesterol 11/15/2014 130* 0 - 99 mg/dL Final  . Total CHOL/HDL Ratio 11/15/2014 3   Final                  Men          Women1/2 Average Risk  3.4          3.3Average Risk          5.0          4.42X Average Risk          9.6          7.13X Average Risk          15.0          11.0                      . NonHDL 11/15/2014 141.79   Final   NOTE:  Non-HDL goal should be 30 mg/dL higher than patient's LDL goal (i.e. LDL goal of < 70 mg/dL, would have non-HDL goal of < 100 mg/dL)  . TSH 11/15/2014 3.23  0.35 - 4.50 uIU/mL Final  . VITD 11/15/2014 59.53  30.00 - 100.00 ng/mL Final      Review of Systems Review of Systems  Constitutional: Negative for fever, appetite change, fatigue and unexpected weight change.  Eyes: Negative for pain and visual disturbance.  Respiratory: Negative for cough and shortness of breath.   Cardiovascular: Negative for cp or palpitations    Gastrointestinal: Negative for nausea, diarrhea and constipation.  Genitourinary: Negative for urgency and frequency.  Skin: Negative for pallor or rash   Neurological: Negative for weakness, light-headedness, numbness and headaches.  Hematological: Negative for adenopathy. Does not bruise/bleed easily.  Psychiatric/Behavioral: Negative for dysphoric mood. The patient is not nervous/anxious.         Objective:   Physical Exam  Constitutional: She appears well-developed and well-nourished. No distress.  Well appearing   Eyes:  Conjunctivae and EOM are normal. Pupils are equal, round, and reactive to light.  Neck: Normal range of motion. Neck supple.  Cardiovascular: Normal rate and regular rhythm.   Pulmonary/Chest: Effort normal and breath sounds normal.  Musculoskeletal: She exhibits no edema.  Mild thoracolumbar scoliosis   Lymphadenopathy:    She has no cervical adenopathy.  Neurological: She is alert. She has normal reflexes. She exhibits normal muscle tone.  Skin: Skin is warm and dry. No rash noted.  3-4 cm mass with fatty consistency under L scapula nt No erythema   Consistent with lipoma  Psychiatric: She has a normal mood and affect.          Assessment & Plan:   Problem List Items Addressed This Visit      Other   Lipoma - Primary    Small - 3-4 cm fatty lump under L scapula is consistent with lipoma  No tenderness  Mild thoracolumbar scoliosis Will obs this - consdider imaging or surgical consult if it enlarges or becomes uncomfortable        Other Visit Diagnoses    Need for influenza vaccination        Relevant Orders    Flu Vaccine QUAD 36+ mos PF IM (Fluarix & Fluzone Quad PF) (Completed)

## 2014-12-20 NOTE — Patient Instructions (Signed)
Flu shot today Labs look pretty good  I think the lump on your back is a lipoma (fatty deposit)- we can watch this, let me know if it starts to bother you  You have mild scoliosis which makes it look more prominent   Keep taking care of yourself

## 2014-12-21 NOTE — Assessment & Plan Note (Signed)
Small - 3-4 cm fatty lump under L scapula is consistent with lipoma  No tenderness  Mild thoracolumbar scoliosis Will obs this - consdider imaging or surgical consult if it enlarges or becomes uncomfortable

## 2015-02-05 ENCOUNTER — Other Ambulatory Visit: Payer: Self-pay | Admitting: Family Medicine

## 2015-02-06 NOTE — Telephone Encounter (Signed)
Rx called in as prescribed 

## 2015-02-06 NOTE — Telephone Encounter (Signed)
Please phone in the fioricet I sent the others Thanks

## 2015-02-06 NOTE — Telephone Encounter (Signed)
Electronic refill request, both fosamax and Fioricet are due for a refill and meclizine isn't on med list, please advise

## 2015-05-16 ENCOUNTER — Encounter: Payer: Self-pay | Admitting: Family Medicine

## 2015-05-16 ENCOUNTER — Ambulatory Visit (INDEPENDENT_AMBULATORY_CARE_PROVIDER_SITE_OTHER): Payer: PPO | Admitting: Family Medicine

## 2015-05-16 VITALS — BP 112/70 | HR 58 | Temp 98.0°F | Ht 62.75 in | Wt 144.5 lb

## 2015-05-16 DIAGNOSIS — Z1159 Encounter for screening for other viral diseases: Secondary | ICD-10-CM | POA: Diagnosis not present

## 2015-05-16 DIAGNOSIS — E7800 Pure hypercholesterolemia, unspecified: Secondary | ICD-10-CM

## 2015-05-16 DIAGNOSIS — R21 Rash and other nonspecific skin eruption: Secondary | ICD-10-CM

## 2015-05-16 DIAGNOSIS — E559 Vitamin D deficiency, unspecified: Secondary | ICD-10-CM

## 2015-05-16 DIAGNOSIS — D126 Benign neoplasm of colon, unspecified: Secondary | ICD-10-CM

## 2015-05-16 DIAGNOSIS — H9190 Unspecified hearing loss, unspecified ear: Secondary | ICD-10-CM

## 2015-05-16 DIAGNOSIS — E2839 Other primary ovarian failure: Secondary | ICD-10-CM

## 2015-05-16 DIAGNOSIS — M8580 Other specified disorders of bone density and structure, unspecified site: Secondary | ICD-10-CM

## 2015-05-16 DIAGNOSIS — Z0000 Encounter for general adult medical examination without abnormal findings: Secondary | ICD-10-CM

## 2015-05-16 DIAGNOSIS — F4541 Pain disorder exclusively related to psychological factors: Secondary | ICD-10-CM | POA: Insufficient documentation

## 2015-05-16 HISTORY — DX: Encounter for general adult medical examination without abnormal findings: Z00.00

## 2015-05-16 HISTORY — DX: Other primary ovarian failure: E28.39

## 2015-05-16 HISTORY — DX: Rash and other nonspecific skin eruption: R21

## 2015-05-16 HISTORY — DX: Pain disorder exclusively related to psychological factors: F45.41

## 2015-05-16 LAB — COMPREHENSIVE METABOLIC PANEL
ALBUMIN: 4.8 g/dL (ref 3.5–5.2)
ALK PHOS: 41 U/L (ref 39–117)
ALT: 15 U/L (ref 0–35)
AST: 21 U/L (ref 0–37)
BUN: 22 mg/dL (ref 6–23)
CO2: 29 mEq/L (ref 19–32)
CREATININE: 0.75 mg/dL (ref 0.40–1.20)
Calcium: 10.1 mg/dL (ref 8.4–10.5)
Chloride: 101 mEq/L (ref 96–112)
GFR: 81.39 mL/min (ref >60.00)
GLUCOSE: 94 mg/dL (ref 70–99)
POTASSIUM: 3.7 meq/L (ref 3.5–5.1)
SODIUM: 139 meq/L (ref 135–145)
TOTAL PROTEIN: 7.8 g/dL (ref 6.0–8.3)
Total Bilirubin: 0.6 mg/dL (ref 0.2–1.2)

## 2015-05-16 LAB — HEPATITIS C ANTIBODY: HCV AB: NEGATIVE

## 2015-05-16 LAB — LIPID PANEL
CHOLESTEROL: 221 mg/dL — AB (ref 0–200)
HDL: 76.6 mg/dL (ref >39.00)
LDL Cholesterol: 132 mg/dL — ABNORMAL HIGH (ref 0–99)
NONHDL: 143.9
Total CHOL/HDL Ratio: 3
Triglycerides: 61 mg/dL (ref 0.0–149.0)
VLDL: 12.2 mg/dL (ref 0.0–40.0)

## 2015-05-16 MED ORDER — TRIAMCINOLONE ACETONIDE 0.1 % EX CREA: 1.0000 "application " | TOPICAL_CREAM | Freq: Every day | CUTANEOUS | Status: DC | PRN

## 2015-05-16 MED ORDER — MECLIZINE HCL 25 MG PO TABS: ORAL_TABLET | ORAL | Status: DC

## 2015-05-16 MED ORDER — FLUTICASONE PROPIONATE 50 MCG/ACT NA SUSP: 2.0000 | Freq: Every day | NASAL | Status: DC

## 2015-05-16 NOTE — Progress Notes (Signed)
Pre visit review using our clinic review tool, if applicable. No additional management support is needed unless otherwise documented below in the visit note. 

## 2015-05-16 NOTE — Progress Notes (Signed)
Subjective:    Patient ID: Kelsey Haley, female    DOB: June 12, 1945, 70 y.o.   MRN: [id number]  HPI Here for annual medicare wellness visit as well as chronic/acute medical problems   I have personally reviewed the Medicare Annual Wellness questionnaire and have noted 1. The patient's medical and social history 2. Their use of alcohol, tobacco or illicit drugs 3. Their current medications and supplements 4. The patient's functional ability including ADL's, fall risks, home safety risks and hearing or visual             impairment. 5. Diet and physical activities 6. Evidence for depression or mood disorders  The patients weight, height, BMI have been recorded in the chart and visual acuity is per eye clinic.  I have made referrals, counseling and provided education to the patient based review of the above and I have provided the pt with a written personalized care plan for preventive services. Reviewed and updated provider list, see scanned forms.  Feeling good overall   See scanned forms.  Routine anticipatory guidance given to patient.  See health maintenance.  Colon cancer screening 1/12 colonoscopy 5 year recall due to polyps (is due now) - will refer for her f/u  Breast cancer screening 10/15 - normal -will set that up herself at [place] for screen  Self breast exam-no new lumps  Flu  10/16 Tetanus vaccine 3/13  Pneumovax - complete on both of those  Zoster vaccine 10/14  dexa 7/14 - due for 2 year f/u  On fosamax -no problems at all (on year 2)  Takes ca and D and exercises regularly  No falls or fractures  Advance directive-does not have one - she will get that worked on (given literature and forms today)  Hep C screening -she is interested in getting that  Cognitive function addressed- see scanned forms- and if abnormal then additional documentation follows. Thinks her memory is "not to bad" - occasionally searches for a word and takes a minute - lately  (esp with the  [place] language)  Not getting lost or confused  Is social Does travel    [place] reviewed  Meds, vitals, and allergies reviewed.   ROS: See HPI.  Otherwise negative.     Hearing loss - getting worse / 2-3 y ago was eval and "was not too bad"  Wants to go back and get re eval   Hyperlipidemia Lab Results  Component Value Date   CHOL 217* 11/15/2014   CHOL 223* 11/02/2013   CHOL 238* 08/10/2012   Lab Results  Component Value Date   HDL 74.80 11/15/2014   HDL 73.80 11/02/2013   HDL 78.20 08/10/2012   Lab Results  Component Value Date   LDLCALC 130* 11/15/2014   LDLCALC 139* 11/02/2013   Lab Results  Component Value Date   TRIG 58.0 11/15/2014   TRIG 49.0 11/02/2013   TRIG 67.0 08/10/2012   Lab Results  Component Value Date   CHOLHDL 3 11/15/2014   CHOLHDL 3 11/02/2013   CHOLHDL 3 08/10/2012   Lab Results  Component Value Date   LDLDIRECT 143.0 08/10/2012   LDLDIRECT 142.9 06/02/2011   LDLDIRECT 133.3 01/08/2010  improved LDL  She eats healthy / makes good choices Junk and red meat give her GI upset anyway - she does better with more fruit/veg and lean meats  This is more difficult when she travels  She cooks and does not eat out often  Also drinks a  lot of water Also exercises     Hx of D def With current supplementation -improved last summer with level in the 50s  Taking that diligently   Hx of headaches fiorcet is no longer covered  Does not take it very often overall  Perhaps twice per month  Last dose was last week   Patient Active Problem List   Diagnosis Date Noted  . Encounter for Medicare annual wellness exam 05/16/2015  . Stress headaches 05/16/2015  . Estrogen deficiency 05/16/2015  . Need for hepatitis C screening test 05/16/2015  . Rash and nonspecific skin eruption 05/16/2015  . Lipoma 12/20/2014  . Bee sting reaction 10/23/2014  . Encounter for routine gynecological examination 08/16/2012  . Other screening mammogram  06/02/2011  . Vitamin D deficiency 01/08/2010  . HYPERCHOLESTEROLEMIA 01/08/2010  . COLONIC POLYPS 10/01/2009  . Hearing loss 11/22/2008  . INTERMITTENT VERTIGO 11/22/2008  . Osteopenia 01/27/2007   Past Medical History  Diagnosis Date  . Vertigo   . Hearing loss   . Osteopenia    No past surgical history on file. Social History  Substance Use Topics  . Smoking status: Former Smoker    Quit date: 03/17/1994  . Smokeless tobacco: None     Comment: was a light smoker before quitting  . Alcohol Use: 0.0 oz/week    0 Standard drinks or equivalent per week     Comment: occ-wine   No family history on file. No Known Allergies Current Outpatient Prescriptions on File Prior to Visit  Medication Sig Dispense Refill  . alendronate (FOSAMAX) 70 MG tablet TAKE 1 TABLET BY MOUTH ONCE A WEEK ON AN EMPTY STOMACH WITH A FULL GLASS OF WATER 12 tablet 2  . Ascorbic Acid (VITAMIN C) 1000 MG tablet Take 1,000 mg by mouth daily.    . butalbital-acetaminophen-caffeine (FIORICET, ESGIC) 50-325-40 MG tablet TAKE 1 TABLET BY MOUTH EVERY 4 TO 6 HOURS AS NEEDED FOR HEADACHE 30 tablet 0  . Calcium Carbonate-Vit D-Min 600-400 MG-UNIT TABS Take 1 tablet by mouth 2 (two) times daily.    . Cholecalciferol (VITAMIN D3) 2000 UNITS TABS Take 1 tablet by mouth daily.    . Ginkgo Biloba 40 MG TABS Take 1 tablet by mouth daily.     No current facility-administered medications on file prior to visit.     Review of Systems Review of Systems  Constitutional: Negative for fever, appetite change, fatigue and unexpected weight change.  Eyes: Negative for pain and visual disturbance.  Respiratory: Negative for cough and shortness of breath.   Cardiovascular: Negative for cp or palpitations    Gastrointestinal: Negative for nausea, diarrhea and constipation.  Genitourinary: Negative for urgency and frequency.  Skin: Negative for pallor or rash   Neurological: Negative for weakness, light-headedness, numbness and  pos for occ headaches.  Hematological: Negative for adenopathy. Does not bruise/bleed easily.  Psychiatric/Behavioral: Negative for dysphoric mood. The patient is not nervous/anxious.         Objective:   Physical Exam  Constitutional: She appears well-developed and well-nourished. No distress.  Well appearing   HENT:  Head: Normocephalic and atraumatic.  Right Ear: External ear normal.  Left Ear: External ear normal.  Mouth/Throat: Oropharynx is clear and moist.  Eyes: Conjunctivae and EOM are normal. Pupils are equal, round, and reactive to light. No scleral icterus.  Neck: Normal range of motion. Neck supple. No JVD present. Carotid bruit is not present. No thyromegaly present.  Cardiovascular: Normal rate, regular rhythm, normal heart sounds  and intact distal pulses.  Exam reveals no gallop.   Pulmonary/Chest: Effort normal and breath sounds normal. No respiratory distress. She has no wheezes. She exhibits no tenderness.  Abdominal: Soft. Bowel sounds are normal. She exhibits no distension, no abdominal bruit and no mass. There is no tenderness.  Genitourinary: No breast swelling, tenderness, discharge or bleeding.  Breast exam: No mass, nodules, thickening, tenderness, bulging, retraction, inflamation, nipple discharge or skin changes noted.  No axillary or clavicular LA.      Musculoskeletal: Normal range of motion. She exhibits no edema or tenderness.  No kyphosis  Lymphadenopathy:    She has no cervical adenopathy.  Neurological: She is alert. She has normal reflexes. No cranial nerve deficit. She exhibits normal muscle tone. Coordination normal.  Skin: Skin is warm and dry. No rash noted. No erythema. No pallor.  Psychiatric: She has a normal mood and affect.          Assessment & Plan:   Problem List Items Addressed This Visit      Digestive   COLONIC POLYPS    Due now for 5 year recall Ref made       Relevant Orders   Ambulatory referral to Gastroenterology       Nervous and Auditory   Hearing loss    Worsened/bothersome Ref to ENT office for re eval and consid of hearing aides       Relevant Orders   Ambulatory referral to ENT     Musculoskeletal and Integument   Osteopenia    Due for dexa Year 2 of fosamax  No falls or fx  Disc need for calcium/ vitamin D/ wt bearing exercise and bone density test every 2 y to monitor Disc safety/ fracture risk in detail        Rash and nonspecific skin eruption    Recurrent on L hand and neck  Suspect atopic Refilled triamcinolone from derm  Avoid hot water and harsh detergents         Other   Encounter for Medicare annual wellness exam - Primary    Reviewed health habits including diet and exercise and skin cancer prevention Reviewed appropriate screening tests for age  Also reviewed health mt list, fam hx and immunization status , as well as social and family history   See HPI Labs reviewed Needed labs today  Labs today  Don't forget to schedule your mammogram  Call your insurance co (medicare) to see what they cover for headaches  Stop at check out for referral to hearing specialist and colonoscopy and bone density test  Do please work on an Forensic scientist (living will and power of attorney)- see the blue booklet I gave you  I refilled your rash cream   Take care of yourself - I'm glad you are doing well       Estrogen deficiency   Relevant Orders   DG Bone Density   HYPERCHOLESTEROLEMIA    Improved Disc goals for lipids and reasons to control them Rev labs with pt Rev low sat fat diet in detail Enc further good diet habits       Relevant Orders   Comprehensive metabolic panel (Completed)   Lipid panel (Completed)   Need for hepatitis C screening test    Not high risk Screen test today      Relevant Orders   Hepatitis C antibody (Completed)   Stress headaches    Her current med with bubalbital no longer covered She will see if  another similar med is  covered Disc habits to prevent ha and migraine       Vitamin D deficiency    Vitamin D level is therapeutic with current supplementation Disc importance of this to bone and overall health

## 2015-05-16 NOTE — Patient Instructions (Signed)
Labs today  Don't forget to schedule your mammogram  Call your insurance co (medicare) to see what they cover for headaches  Stop at check out for referral to hearing specialist and colonoscopy and bone density test  Do please work on an Forensic scientist (living will and power of attorney)- see the blue booklet I gave you  I refilled your rash cream   Take care of yourself - I'm glad you are doing well

## 2015-05-17 NOTE — Assessment & Plan Note (Signed)
Due for dexa Year 2 of fosamax  No falls or fx  Disc need for calcium/ vitamin D/ wt bearing exercise and bone density test every 2 y to monitor Disc safety/ fracture risk in detail

## 2015-05-17 NOTE — Assessment & Plan Note (Signed)
Due now for 5 year recall Ref made

## 2015-05-17 NOTE — Assessment & Plan Note (Signed)
Recurrent on L hand and neck  Suspect atopic Refilled triamcinolone from derm  Avoid hot water and harsh detergents

## 2015-05-17 NOTE — Assessment & Plan Note (Signed)
Reviewed health habits including diet and exercise and skin cancer prevention Reviewed appropriate screening tests for age  Also reviewed health mt list, fam hx and immunization status , as well as social and family history   See HPI Labs reviewed Needed labs today  Labs today  Don't forget to schedule your mammogram  Call your insurance co (medicare) to see what they cover for headaches  Stop at check out for referral to hearing specialist and colonoscopy and bone density test  Do please work on an Forensic scientist (living will and power of attorney)- see the blue booklet I gave you  I refilled your rash cream   Take care of yourself - I'm glad you are doing well

## 2015-05-17 NOTE — Assessment & Plan Note (Signed)
Worsened/bothersome Ref to ENT office for re eval and consid of hearing aides

## 2015-05-17 NOTE — Assessment & Plan Note (Signed)
Improved Disc goals for lipids and reasons to control them Rev labs with pt Rev low sat fat diet in detail Enc further good diet habits

## 2015-05-17 NOTE — Assessment & Plan Note (Signed)
Vitamin D level is therapeutic with current supplementation Disc importance of this to bone and overall health  

## 2015-05-17 NOTE — Assessment & Plan Note (Signed)
Her current med with bubalbital no longer covered She will see if another similar med is covered Disc habits to prevent ha and migraine

## 2015-05-17 NOTE — Assessment & Plan Note (Signed)
Not high risk Screen test today

## 2015-06-04 DIAGNOSIS — H6121 Impacted cerumen, right ear: Secondary | ICD-10-CM | POA: Diagnosis not present

## 2015-06-04 DIAGNOSIS — H903 Sensorineural hearing loss, bilateral: Secondary | ICD-10-CM | POA: Diagnosis not present

## 2015-06-26 ENCOUNTER — Other Ambulatory Visit: Payer: Self-pay | Admitting: Family Medicine

## 2015-06-26 DIAGNOSIS — Z1231 Encounter for screening mammogram for malignant neoplasm of breast: Secondary | ICD-10-CM

## 2015-07-09 ENCOUNTER — Ambulatory Visit
Admission: RE | Admit: 2015-07-09 | Discharge: 2015-07-09 | Disposition: A | Discharge status: Home / Self Care | Payer: PPO | Source: Ambulatory Visit | Attending: Family Medicine | Admitting: Family Medicine

## 2015-07-09 DIAGNOSIS — M8588 Other specified disorders of bone density and structure, other site: Secondary | ICD-10-CM | POA: Diagnosis not present

## 2015-07-09 DIAGNOSIS — Z1231 Encounter for screening mammogram for malignant neoplasm of breast: Secondary | ICD-10-CM

## 2015-07-09 DIAGNOSIS — M8580 Other specified disorders of bone density and structure, unspecified site: Secondary | ICD-10-CM | POA: Insufficient documentation

## 2015-07-09 DIAGNOSIS — Z780 Asymptomatic menopausal state: Secondary | ICD-10-CM | POA: Diagnosis not present

## 2015-07-09 DIAGNOSIS — E2839 Other primary ovarian failure: Secondary | ICD-10-CM

## 2015-07-09 DIAGNOSIS — Z13820 Encounter for screening for osteoporosis: Secondary | ICD-10-CM | POA: Insufficient documentation

## 2015-08-20 ENCOUNTER — Encounter
Admission: RE | Disposition: A | Discharge status: Home / Self Care | Payer: Self-pay | Source: Ambulatory Visit | Attending: Unknown Physician Specialty

## 2015-08-20 ENCOUNTER — Ambulatory Visit: Payer: PPO | Admitting: Certified Registered Nurse Anesthetist

## 2015-08-20 ENCOUNTER — Ambulatory Visit
Admission: RE | Admit: 2015-08-20 | Discharge: 2015-08-20 | Disposition: A | Discharge status: Home / Self Care | Payer: PPO | Source: Ambulatory Visit | Attending: Unknown Physician Specialty | Admitting: Unknown Physician Specialty

## 2015-08-20 ENCOUNTER — Encounter: Payer: Self-pay | Admitting: *Deleted

## 2015-08-20 DIAGNOSIS — Z79899 Other long term (current) drug therapy: Secondary | ICD-10-CM | POA: Diagnosis not present

## 2015-08-20 DIAGNOSIS — Z79891 Long term (current) use of opiate analgesic: Secondary | ICD-10-CM | POA: Insufficient documentation

## 2015-08-20 DIAGNOSIS — Z1211 Encounter for screening for malignant neoplasm of colon: Secondary | ICD-10-CM | POA: Insufficient documentation

## 2015-08-20 DIAGNOSIS — Z87891 Personal history of nicotine dependence: Secondary | ICD-10-CM | POA: Insufficient documentation

## 2015-08-20 DIAGNOSIS — D125 Benign neoplasm of sigmoid colon: Secondary | ICD-10-CM | POA: Diagnosis not present

## 2015-08-20 DIAGNOSIS — Z9889 Other specified postprocedural states: Secondary | ICD-10-CM | POA: Diagnosis not present

## 2015-08-20 DIAGNOSIS — M8580 Other specified disorders of bone density and structure, unspecified site: Secondary | ICD-10-CM | POA: Diagnosis not present

## 2015-08-20 DIAGNOSIS — Z7951 Long term (current) use of inhaled steroids: Secondary | ICD-10-CM | POA: Diagnosis not present

## 2015-08-20 DIAGNOSIS — K635 Polyp of colon: Secondary | ICD-10-CM | POA: Insufficient documentation

## 2015-08-20 DIAGNOSIS — Z86010 Personal history of colonic polyps: Secondary | ICD-10-CM | POA: Diagnosis not present

## 2015-08-20 DIAGNOSIS — D123 Benign neoplasm of transverse colon: Secondary | ICD-10-CM | POA: Insufficient documentation

## 2015-08-20 HISTORY — PX: COLONOSCOPY WITH PROPOFOL: SHX5780

## 2015-08-20 HISTORY — DX: Headache: R51

## 2015-08-20 HISTORY — DX: Headache, unspecified: R51.9

## 2015-08-20 SURGERY — COLONOSCOPY WITH PROPOFOL
Anesthesia: General

## 2015-08-20 MED ORDER — MIDAZOLAM HCL 2 MG/2ML IJ SOLN
INTRAMUSCULAR | Status: DC | PRN
  Administered 2015-08-20: 1 mg via INTRAVENOUS

## 2015-08-20 MED ORDER — SODIUM CHLORIDE 0.9 % IV SOLN: INTRAVENOUS | Status: DC

## 2015-08-20 MED ORDER — LIDOCAINE HCL (CARDIAC) 20 MG/ML IV SOLN
INTRAVENOUS | Status: DC | PRN
  Administered 2015-08-20: 30 mg via INTRAVENOUS

## 2015-08-20 MED ORDER — PROPOFOL 500 MG/50ML IV EMUL
INTRAVENOUS | Status: DC | PRN
  Administered 2015-08-20: 120 ug/kg/min via INTRAVENOUS

## 2015-08-20 MED ORDER — SODIUM CHLORIDE 0.9 % IV SOLN
INTRAVENOUS | Status: DC
  Administered 2015-08-20: 1000 mL via INTRAVENOUS

## 2015-08-20 MED ORDER — PROPOFOL 10 MG/ML IV BOLUS
INTRAVENOUS | Status: DC | PRN
  Administered 2015-08-20 (×3): 30 mg via INTRAVENOUS
  Administered 2015-08-20 (×2): 20 mg via INTRAVENOUS

## 2015-08-20 NOTE — Op Note (Signed)
[facility] Gastroenterology Patient Name: Kelsey Haley Procedure Date: 08/20/2015 10:59 AM MRN: [id number] Account #: [id number] Date of Birth: 1945/12/26 Admit Type: Outpatient Age: 70 Room: [facility] ENDO ROOM 1 Gender: Female Note Status: Finalized Procedure:            Colonoscopy Indications:          High risk colon cancer surveillance: Personal history                        of colonic polyps Providers:            Manya Silvas, MD Referring MD:         Wynelle Fanny. Tower, MD (Referring MD) Medicines:            Propofol per Anesthesia Complications:        No immediate complications. Procedure:            Pre-Anesthesia Assessment:                       - After reviewing the risks and benefits, the patient                        was deemed in satisfactory condition to undergo the                        procedure.                       After obtaining informed consent, the colonoscope was                        passed under direct vision. Throughout the procedure,                        the patient's blood pressure, pulse, and oxygen                        saturations were monitored continuously. The                        Colonoscope was introduced through the anus and                        advanced to the the cecum, identified by appendiceal                        orifice and ileocecal valve. The colonoscopy was                        unusually difficult due to significant looping and a                        tortuous colon. Successful completion of the procedure                        was aided by applying abdominal pressure. The patient                        tolerated the procedure well. The quality of the bowel  preparation was excellent. Findings:      The colon was very long and tortuous required multiple times pulling the       scope back to take loops out of the colon.      A small polyp was found in the transverse colon. The  polyp was sessile.       The polyp was removed with a hot snare. Resection and retrieval were       complete.      Two sessile polyps were found in the sigmoid colon and splenic flexure.       The polyps were diminutive in size. These polyps were removed with a       cold biopsy forceps. Resection and retrieval were complete. Impression:           - One small polyp in the transverse colon, removed with                        a hot snare. Resected and retrieved.                       - Two diminutive polyps in the sigmoid colon and at the                        splenic flexure, removed with a cold biopsy forceps.                        Resected and retrieved. Recommendation:       - Await pathology results. Manya Silvas, MD 08/20/2015 11:43:55 AM This report has been signed electronically. Number of Addenda: 0 Note Initiated On: 08/20/2015 10:59 AM Scope Withdrawal Time: 0 hours 13 minutes 45 seconds  Total Procedure Duration: 0 hours 36 minutes 21 seconds       [facility]

## 2015-08-20 NOTE — Anesthesia Postprocedure Evaluation (Signed)
Anesthesia Post Note  Patient: Kelsey Haley  Procedure(s) Performed: Procedure(s) (LRB): COLONOSCOPY WITH PROPOFOL (N/A)  Patient location during evaluation: PACU Anesthesia Type: General Level of consciousness: awake and alert Pain management: pain level controlled Vital Signs Assessment: post-procedure vital signs reviewed and stable Respiratory status: spontaneous breathing, nonlabored ventilation, respiratory function stable and patient connected to nasal cannula oxygen Cardiovascular status: blood pressure returned to baseline and stable Postop Assessment: no signs of nausea or vomiting Anesthetic complications: no    Last Vitals:  Filed Vitals:   08/20/15 1205 08/20/15 1215  BP: 123/70 137/80  Pulse: 47 51  Temp:    Resp: 14 20    Last Pain: There were no vitals filed for this visit.               Molli Barrows

## 2015-08-20 NOTE — Anesthesia Procedure Notes (Signed)
Date/Time: 08/20/2015 11:00 AM Performed by: Johnna Acosta Pre-anesthesia Checklist: Patient identified, Emergency Drugs available, Suction available, Patient being monitored and Timeout performed Patient Re-evaluated:Patient Re-evaluated prior to inductionOxygen Delivery Method: Nasal cannula Preoxygenation: Pre-oxygenation with 100% oxygen

## 2015-08-20 NOTE — Transfer of Care (Signed)
Immediate Anesthesia Transfer of Care Note  Patient: Kelsey Haley  Procedure(s) Performed: Procedure(s): COLONOSCOPY WITH PROPOFOL (N/A)  Patient Location: PACU  Anesthesia Type:General  Level of Consciousness: awake and alert   Airway & Oxygen Therapy: Patient Spontanous Breathing and Patient connected to nasal cannula oxygen  Post-op Assessment: Report given to RN and Post -op Vital signs reviewed and stable  Post vital signs: Reviewed and stable  Last Vitals:  Filed Vitals:   08/20/15 1039 08/20/15 1145  BP: 132/78 89/54  Pulse: 74 55  Temp: 35.8 C 35.7 C  Resp: 18 16    Last Pain: There were no vitals filed for this visit.       Complications: No apparent anesthesia complications

## 2015-08-20 NOTE — Anesthesia Preprocedure Evaluation (Signed)
Anesthesia Evaluation  Patient identified by MRN, date of birth, ID band Patient awake    Reviewed: Allergy & Precautions, H&P , NPO status , Patient's Chart, lab work & pertinent test results, reviewed documented beta blocker date and time   Airway Mallampati: II   Neck ROM: full    Dental  (+) Poor Dentition, Teeth Intact   Pulmonary neg pulmonary ROS, former smoker,    Pulmonary exam normal        Cardiovascular negative cardio ROS Normal cardiovascular exam Rhythm:regular Rate:Normal     Neuro/Psych  Headaches, negative neurological ROS  negative psych ROS   GI/Hepatic negative GI ROS, Neg liver ROS,   Endo/Other  negative endocrine ROS  Renal/GU negative Renal ROS  negative genitourinary   Musculoskeletal   Abdominal   Peds  Hematology negative hematology ROS (+)   Anesthesia Other Findings Past Medical History:   Vertigo                                                      Hearing loss                                                 Osteopenia                                                   Headache                                                       Comment:occasional migranes Past Surgical History:   left knee surgery                                             TONSILLECTOMY                                               BMI    Body Mass Index   26.51 kg/m 2     Reproductive/Obstetrics                             Anesthesia Physical Anesthesia Plan  ASA: II  Anesthesia Plan: General   Post-op Pain Management:    Induction:   Airway Management Planned:   Additional Equipment:   Intra-op Plan:   Post-operative Plan:   Informed Consent: I have reviewed the patients History and Physical, chart, labs and discussed the procedure including the risks, benefits and alternatives for the proposed anesthesia with the patient or authorized representative who has indicated  his/her understanding and acceptance.   Dental Advisory Given  Plan Discussed with: CRNA  Anesthesia Plan Comments:         Anesthesia Quick Evaluation

## 2015-08-20 NOTE — H&P (Signed)
Primary Care Physician:  Loura Pardon, MD Primary Gastroenterologist:  Dr. Vira Agar  Pre-Procedure History & Physical: HPI:  Kelsey Haley is a 70 y.o. female is here for an colonoscopy.   Past Medical History  Diagnosis Date  . Vertigo   . Hearing loss   . Osteopenia   . Headache     occasional migranes    Past Surgical History  Procedure Laterality Date  . Left knee surgery    . Tonsillectomy      Prior to Admission medications   Medication Sig Start Date End Date Taking? Authorizing Provider  alendronate (FOSAMAX) 70 MG tablet TAKE 1 TABLET BY MOUTH ONCE A WEEK ON AN EMPTY STOMACH WITH A FULL GLASS OF WATER 02/06/15   Abner Greenspan, MD  Ascorbic Acid (VITAMIN C) 1000 MG tablet Take 1,000 mg by mouth daily.    Historical Provider, MD  butalbital-acetaminophen-caffeine (FIORICET, ESGIC) 50-325-40 MG tablet TAKE 1 TABLET BY MOUTH EVERY 4 TO 6 HOURS AS NEEDED FOR HEADACHE 02/06/15   Abner Greenspan, MD  Calcium Carbonate-Vit D-Min 600-400 MG-UNIT TABS Take 1 tablet by mouth 2 (two) times daily.    Historical Provider, MD  Cholecalciferol (VITAMIN D3) 2000 UNITS TABS Take 1 tablet by mouth daily.    Historical Provider, MD  fluticasone (FLONASE) 50 MCG/ACT nasal spray Place 2 sprays into both nostrils daily. 05/16/15   Abner Greenspan, MD  Ginkgo Biloba 40 MG TABS Take 1 tablet by mouth daily.    Historical Provider, MD  meclizine (ANTIVERT) 25 MG tablet TAKE 1 TO 2 TABLETS BY MOUTH EVERY 8 HOURS AS NEEDED FOR DIZZINESS OR VERTIGO 05/16/15   Abner Greenspan, MD  triamcinolone cream (KENALOG) 0.1 % Apply 1 application topically daily as needed. To affected areas 05/16/15   Abner Greenspan, MD    Allergies as of 08/07/2015  . (No Known Allergies)    Family History  Problem Relation Age of Onset  . Breast cancer Neg Hx     Social History   Social History  . Marital Status: Married    Spouse Name: N/A  . Number of Children: N/A  . Years of Education: N/A   Occupational History  . Not  on file.   Social History Main Topics  . Smoking status: Former Smoker    Quit date: 03/17/1994  . Smokeless tobacco: Not on file     Comment: was a light smoker before quitting  . Alcohol Use: 0.0 oz/week    0 Standard drinks or equivalent per week     Comment: occ-wine  . Drug Use: No  . Sexual Activity: Not on file   Other Topics Concern  . Not on file   Social History Narrative    Review of Systems: See HPI, otherwise negative ROS  Physical Exam: BP 132/78 mmHg  Pulse 74  Temp(Src) 96.4 F (35.8 C) (Tympanic)  Resp 18  Ht 5\' 2"  (1.575 m)  Wt 65.772 kg (145 lb)  BMI 26.51 kg/m2  SpO2 100% General:   Alert,  pleasant and cooperative in NAD Head:  Normocephalic and atraumatic. Neck:  Supple; no masses or thyromegaly. Lungs:  Clear throughout to auscultation.    Heart:  Regular rate and rhythm. Abdomen:  Soft, nontender and nondistended. Normal bowel sounds, without guarding, and without rebound.   Neurologic:  Alert and  oriented x4;  grossly normal neurologically.  Impression/Plan: TUWANNA Haley is here for an colonoscopy to be performed for [facility] colon  polyps  Risks, benefits, limitations, and alternatives regarding  colonoscopy have been reviewed with the patient.  Questions have been answered.  All parties agreeable.   Gaylyn Cheers, MD  08/20/2015, 10:54 AM

## 2015-08-21 ENCOUNTER — Encounter: Payer: Self-pay | Admitting: Unknown Physician Specialty

## 2015-08-22 LAB — SURGICAL PATHOLOGY

## 2015-09-29 ENCOUNTER — Other Ambulatory Visit: Payer: Self-pay | Admitting: Family Medicine

## 2015-10-01 NOTE — Telephone Encounter (Signed)
Received refill electronically Last refill 05/16/15 #30/1, last office visit same day

## 2015-10-01 NOTE — Telephone Encounter (Signed)
Please refill times 3 

## 2015-10-01 NOTE — Telephone Encounter (Signed)
Refill sent to pharmacy as instructed. 

## 2015-10-30 ENCOUNTER — Ambulatory Visit (INDEPENDENT_AMBULATORY_CARE_PROVIDER_SITE_OTHER): Payer: PPO | Admitting: Family Medicine

## 2015-10-30 ENCOUNTER — Encounter: Payer: Self-pay | Admitting: Family Medicine

## 2015-10-30 ENCOUNTER — Ambulatory Visit (INDEPENDENT_AMBULATORY_CARE_PROVIDER_SITE_OTHER)
Admission: RE | Admit: 2015-10-30 | Discharge: 2015-10-30 | Disposition: A | Discharge status: Home / Self Care | Payer: PPO | Source: Ambulatory Visit | Attending: Family Medicine | Admitting: Family Medicine

## 2015-10-30 VITALS — BP 108/64 | HR 56 | Temp 98.4°F | Ht 62.75 in | Wt 144.5 lb

## 2015-10-30 DIAGNOSIS — M79641 Pain in right hand: Secondary | ICD-10-CM

## 2015-10-30 DIAGNOSIS — M542 Cervicalgia: Secondary | ICD-10-CM

## 2015-10-30 DIAGNOSIS — M79672 Pain in left foot: Secondary | ICD-10-CM

## 2015-10-30 DIAGNOSIS — M25572 Pain in left ankle and joints of left foot: Secondary | ICD-10-CM | POA: Diagnosis not present

## 2015-10-30 NOTE — Progress Notes (Signed)
Pre visit review using our clinic review tool, if applicable. No additional management support is needed unless otherwise documented below in the visit note. 

## 2015-10-30 NOTE — Patient Instructions (Signed)
Hold your fosamax for 2 weeks (continue other medicines)  Let me know if symptoms improve off the medicine Use gentle heat on your neck - I think it is strained  Try to get a foam cervical support pillow  Use ice on your hand (I suspect some arthritis) We will xray foot and ankle today to make sure there is no stress fracture Wear supportive shoes (with a good arch Take ibuprofen as needed- always with food

## 2015-10-30 NOTE — Assessment & Plan Note (Signed)
Pain is over head of first metacarpal and has improved  Nl rom and exam  Will continue ibuprofen Ice prn  Suspect either OA or tendonitis  Will obs  Pt will also hold fosamax for 2 wk to see if this helps her multiple pains and let me know

## 2015-10-30 NOTE — Progress Notes (Signed)
Subjective:    Patient ID: Kelsey Haley, female    DOB: 06/14/1945, 70 y.o.   MRN: [id number]  HPI Here for body pain  Hand ,neck, foot    Started at base of R thumb (mcp area) Was swollen  Hurts to move wrist  800 mg of ibuprofen helps Is stiff in am  It is getting better (after 3-4 weeks) -started when driving   Then noticed pain in R neck when she moves it - esp to tilt to L  Over muscle- trap area -more than 2 wk  Rad to shoulder - and can hear crackling when she moves it  No change with raising arm  This area has not been swollen  Ibuprofen helps a bit    Then L foot -pain over dorsal foot and ankle Little swollen  Hurts worse when she walks a lot  This started last week    Wt Readings from Last 3 Encounters:  10/30/15 144 lb 8 oz (65.5 kg)  08/20/15 145 lb (65.8 kg)  05/16/15 144 lb 8 oz (65.5 kg)   bmi is 25.8  Patient Active Problem List   Diagnosis Date Noted  . Right hand pain 10/30/2015  . Neck pain 10/30/2015  . Left foot pain 10/30/2015  . Encounter for Medicare annual wellness exam 05/16/2015  . Stress headaches 05/16/2015  . Estrogen deficiency 05/16/2015  . Need for hepatitis C screening test 05/16/2015  . Rash and nonspecific skin eruption 05/16/2015  . Lipoma 12/20/2014  . Bee sting reaction 10/23/2014  . Encounter for routine gynecological examination 08/16/2012  . Other screening mammogram 06/02/2011  . Vitamin D deficiency 01/08/2010  . HYPERCHOLESTEROLEMIA 01/08/2010  . COLONIC POLYPS 10/01/2009  . Hearing loss 11/22/2008  . INTERMITTENT VERTIGO 11/22/2008  . Osteopenia 01/27/2007   Past Medical History:  Diagnosis Date  . Headache    occasional migranes  . Hearing loss   . Osteopenia   . Vertigo    Past Surgical History:  Procedure Laterality Date  . COLONOSCOPY WITH PROPOFOL N/A 08/20/2015   Procedure: COLONOSCOPY WITH PROPOFOL;  Surgeon: Manya Silvas, MD;  Location[facility] ENDOSCOPY;  Service: Endoscopy;  Laterality:  N/A;  . left knee surgery    . TONSILLECTOMY     Social History  Substance Use Topics  . Smoking status: Former Smoker    Quit date: 03/17/1994  . Smokeless tobacco: Never Used     Comment: was a light smoker before quitting  . Alcohol use 0.0 oz/week     Comment: occ-wine   Family History  Problem Relation Age of Onset  . Breast cancer Neg Hx    No Known Allergies Current Outpatient Prescriptions on File Prior to Visit  Medication Sig Dispense Refill  . alendronate (FOSAMAX) 70 MG tablet TAKE 1 TABLET BY MOUTH ONCE A WEEK ON AN EMPTY STOMACH WITH A FULL GLASS OF WATER 12 tablet 2  . Ascorbic Acid (VITAMIN C) 1000 MG tablet Take 1,000 mg by mouth daily.    . butalbital-acetaminophen-caffeine (FIORICET, ESGIC) 50-325-40 MG tablet TAKE 1 TABLET BY MOUTH EVERY 4 TO 6 HOURS AS NEEDED FOR HEADACHE 30 tablet 0  . Calcium Carbonate-Vit D-Min 600-400 MG-UNIT TABS Take 1 tablet by mouth 2 (two) times daily.    . Cholecalciferol (VITAMIN D3) 2000 UNITS TABS Take 1 tablet by mouth daily.    . fluticasone (FLONASE) 50 MCG/ACT nasal spray Place 2 sprays into both nostrils daily. 16 g 11  . Ginkgo Biloba 40  MG TABS Take 1 tablet by mouth daily.    . meclizine (ANTIVERT) 25 MG tablet TAKE 1 TO 2 TABLETS BY MOUTH EVERY 8 HOURS AS NEEDED FOR DIZZINESS OR VERTIGO 30 tablet 2  . triamcinolone cream (KENALOG) 0.1 % Apply 1 application topically daily as needed. To affected areas 15 g 3   No current facility-administered medications on file prior to visit.     Review of Systems Review of Systems  Constitutional: Negative for fever, appetite change, fatigue and unexpected weight change.  Eyes: Negative for pain and visual disturbance.  Respiratory: Negative for cough and shortness of breath.   Cardiovascular: Negative for cp or palpitations    Gastrointestinal: Negative for nausea, diarrhea and constipation.  Genitourinary: Negative for urgency and frequency.  Skin: Negative for pallor or rash   MSK  pos for joint and muscle pain (various) without swelling) Neurological: Negative for weakness, light-headedness, numbness and headaches.  Hematological: Negative for adenopathy. Does not bruise/bleed easily.  Psychiatric/Behavioral: Negative for dysphoric mood. The patient is not nervous/anxious.         Objective:   Physical Exam  Constitutional: She appears well-developed and well-nourished. No distress.  HENT:  Head: Normocephalic and atraumatic.  Eyes: Conjunctivae and EOM are normal. Pupils are equal, round, and reactive to light. No scleral icterus.  Neck: Normal range of motion. Neck supple.  Cardiovascular: Normal rate and regular rhythm.   Pulmonary/Chest: Effort normal and breath sounds normal.  Abdominal: Soft. There is no tenderness.  Musculoskeletal: She exhibits tenderness. She exhibits no edema or deformity.       Left ankle: She exhibits normal range of motion, no ecchymosis, no deformity and normal pulse. Tenderness. No lateral malleolus and no medial malleolus tenderness found. Achilles tendon normal.       Cervical back: She exhibits tenderness and spasm. She exhibits normal range of motion, no bony tenderness, no swelling and no deformity.       Right hand: She exhibits tenderness. She exhibits normal range of motion, normal two-point discrimination and no swelling. Normal sensation noted. Normal strength noted.  Tender at head of first metacarpal in R hand  No crepitus or swelling   Tender over R side of neck-worse with rotating Left No bony tenderness or crepitus  Tender over trapezius  Tender over ant L ankle (tendon) and proximal dorsal foot = pain to plantar flex but nl rom     Lymphadenopathy:    She has no cervical adenopathy.  Neurological: She displays no atrophy. No sensory deficit. She exhibits normal muscle tone. Coordination normal.  Skin: Skin is warm and dry. No rash noted. No erythema. No pallor.  Psychiatric: She has a normal mood and affect.           Assessment & Plan:   Problem List Items Addressed This Visit      Other   Right hand pain    Pain is over head of first metacarpal and has improved  Nl rom and exam  Will continue ibuprofen Ice prn  Suspect either OA or tendonitis  Will obs  Pt will also hold fosamax for 2 wk to see if this helps her multiple pains and let me know       Neck pain    Suggest use of cervical support pillow/heat and stretches Ibuprofen prn  Update if no imp  May consider films and /or PT      Left foot pain    Dorsal foot and ankle- w/o point  tenderness Xray today/ r/o stress fx Ice/elevation and rel rest Ibuprofen prn   She will also hold fosamax to see if it is causing any joint c/o      Relevant Orders   DG Foot Complete Left (Completed)   DG Ankle Complete Left (Completed)    Other Visit Diagnoses   None.

## 2015-11-01 NOTE — Assessment & Plan Note (Signed)
Dorsal foot and ankle- w/o point tenderness Xray today/ r/o stress fx Ice/elevation and rel rest Ibuprofen prn   She will also hold fosamax to see if it is causing any joint c/o

## 2015-11-01 NOTE — Assessment & Plan Note (Signed)
Suggest use of cervical support pillow/heat and stretches Ibuprofen prn  Update if no imp  May consider films and /or PT

## 2015-11-10 ENCOUNTER — Other Ambulatory Visit: Payer: Self-pay | Admitting: Family Medicine

## 2016-01-01 ENCOUNTER — Other Ambulatory Visit: Payer: Self-pay | Admitting: Family Medicine

## 2016-01-01 NOTE — Telephone Encounter (Signed)
Pt walked in today wanting to get a hep a vaccine she will be traveling to [place]  She also wants to get her flu shot at same time. Is it ok to schedule

## 2016-01-01 NOTE — Telephone Encounter (Signed)
That is fine , please schedule a nurse visit

## 2016-01-02 NOTE — Telephone Encounter (Signed)
Are they going to give her the injections there or in a clinic? --thanks/ I just need to know before I send it

## 2016-01-02 NOTE — Telephone Encounter (Signed)
pts daughter wants pt to get cholera and Typhoid vaccinations when she gets the Hep A. pts daughter is going to cb with the pharmacy in [place];(pt leaving for [place] today at 5 PM) that will give pt cholera,typhoid and Hep A immunizations and will ask Dr Glori Bickers to send order for vaccinations to that pharmacy. FYI to Dr Glori Bickers.

## 2016-01-02 NOTE — Telephone Encounter (Signed)
pts daughter left v/m that pt wants to get Hep A and typhoid injectable immunization at [facility] [address]. Request order sent to [facility] in [place]. pts daughter request cb when done.

## 2016-01-02 NOTE — Telephone Encounter (Signed)
Please clarify- she wants to get the px filled for the immunizations there and then come here for the shots? Or is she getting them there? Where is she traveling?  Thanks

## 2016-01-02 NOTE — Telephone Encounter (Signed)
Appointment 10/24 Pt aware

## 2016-01-03 MED ORDER — HEPATITIS A VACCINE 1440 EL U/ML IM SUSP: 1.0000 mL | Freq: Once | INTRAMUSCULAR | 0 refills | Status: AC

## 2016-01-03 MED ORDER — TYPHOID VI POLYSACCHARIDE VACC 25 MCG/0.5ML IM SOLN: 0.5000 mL | Freq: Once | INTRAMUSCULAR | 0 refills | Status: AC

## 2016-01-03 NOTE — Telephone Encounter (Signed)
Spoke with pt's daughter they are getting the pharmacist to give the vaccines not a clinic, she said pt needs Hep A vaccine and she just wants to make sure that you send in the Rx for the typhoid injection not the oral vaccine

## 2016-01-03 NOTE — Addendum Note (Signed)
Addended by: Loura Pardon A on: 01/03/2016 03:57 PM   Modules accepted: Orders

## 2016-01-03 NOTE — Telephone Encounter (Signed)
I found Rxs in EPIC, please verify they are correct before sending them in

## 2016-01-03 NOTE — Telephone Encounter (Signed)
Thank you!! I sent them electronically

## 2016-01-03 NOTE — Addendum Note (Signed)
Addended by: Tammi Sou on: 01/03/2016 03:44 PM   Modules accepted: Orders

## 2016-01-03 NOTE — Telephone Encounter (Signed)
In epic there are several choices for hep A vaccines in terms of units and dosing- what do we use ?   When the typhoid vaccine comes up- it only gives me the option for the PO version- so I do not think I can send it electronically   Could you please call the pharmacy and get details of what they need?   We will probably need to call those in   (I also cannot seem to free text a px on epic for some reason)  Thanks

## 2016-01-08 ENCOUNTER — Ambulatory Visit: Payer: PPO

## 2016-06-18 ENCOUNTER — Other Ambulatory Visit: Payer: Self-pay | Admitting: Family Medicine

## 2016-06-18 NOTE — Telephone Encounter (Signed)
03/21/2011 was the 1st Rx for fosamax (5+ years ago) on file, please advise

## 2016-06-18 NOTE — Telephone Encounter (Signed)
Thanks  She had stopped an re stared the fosamax so can continue it Please refill for a year

## 2016-06-19 NOTE — Telephone Encounter (Signed)
done

## 2016-09-16 ENCOUNTER — Telehealth: Payer: Self-pay | Admitting: Family Medicine

## 2016-09-16 NOTE — Telephone Encounter (Signed)
Left pt message asking to call Allison back directly at [phone] to schedule AWV + labs with Lesia and CPE with PCP. °

## 2016-10-01 ENCOUNTER — Other Ambulatory Visit: Payer: Self-pay | Admitting: Family Medicine

## 2016-10-01 NOTE — Telephone Encounter (Signed)
Last appt was an acute appt on 10/30/15 and no future appts., last refilled on 10/01/15 #30 tabs with 2 additional refills, please advise

## 2016-10-01 NOTE — Telephone Encounter (Signed)
Please schedule fall f/u and refill times 3

## 2016-10-02 NOTE — Telephone Encounter (Signed)
Left voicemail requesting pt to call the office back 

## 2016-10-02 NOTE — Telephone Encounter (Signed)
Pt is due for f/u in the fall per Dr. Glori Bickers, called pt to schedule an appt and no answer so left voicemail requesting pt to call the office back

## 2016-10-06 NOTE — Telephone Encounter (Signed)
CPE scheduled and med refilled  

## 2016-10-23 ENCOUNTER — Other Ambulatory Visit (INDEPENDENT_AMBULATORY_CARE_PROVIDER_SITE_OTHER): Payer: PPO

## 2016-10-23 ENCOUNTER — Telehealth: Payer: Self-pay | Admitting: Family Medicine

## 2016-10-23 DIAGNOSIS — Z0000 Encounter for general adult medical examination without abnormal findings: Secondary | ICD-10-CM | POA: Diagnosis not present

## 2016-10-23 DIAGNOSIS — E559 Vitamin D deficiency, unspecified: Secondary | ICD-10-CM

## 2016-10-23 LAB — CBC WITH DIFFERENTIAL/PLATELET
Basophils Absolute: 0 10*3/uL (ref 0.0–0.1)
Basophils Relative: 0.4 % (ref 0.0–3.0)
EOS PCT: 3.7 % (ref 0.0–5.0)
Eosinophils Absolute: 0.2 10*3/uL (ref 0.0–0.7)
HEMATOCRIT: 40.3 % (ref 36.0–46.0)
HEMOGLOBIN: 14.1 g/dL (ref 12.0–15.0)
LYMPHS ABS: 1.9 10*3/uL (ref 0.7–4.0)
LYMPHS PCT: 40.5 % (ref 12.0–46.0)
MCHC: 35.1 g/dL (ref 30.0–36.0)
MCV: 101.4 fl — AB (ref 78.0–100.0)
MONOS PCT: 11.3 % (ref 3.0–12.0)
Monocytes Absolute: 0.5 10*3/uL (ref 0.1–1.0)
Neutro Abs: 2.1 10*3/uL (ref 1.4–7.7)
Neutrophils Relative %: 44.1 % (ref 43.0–77.0)
Platelets: 243 10*3/uL (ref 150.0–400.0)
RBC: 3.97 Mil/uL (ref 3.87–5.11)
RDW: 14 % (ref 11.5–15.5)
WBC: 4.7 10*3/uL (ref 4.0–10.5)

## 2016-10-23 LAB — COMPREHENSIVE METABOLIC PANEL
ALBUMIN: 4.2 g/dL (ref 3.5–5.2)
ALK PHOS: 39 U/L (ref 39–117)
ALT: 15 U/L (ref 0–35)
AST: 19 U/L (ref 0–37)
BILIRUBIN TOTAL: 0.6 mg/dL (ref 0.2–1.2)
BUN: 17 mg/dL (ref 6–23)
CALCIUM: 9.3 mg/dL (ref 8.4–10.5)
CO2: 32 mEq/L (ref 19–32)
Chloride: 103 mEq/L (ref 96–112)
Creatinine, Ser: 0.74 mg/dL (ref 0.40–1.20)
GFR: 82.31 mL/min (ref >60.00)
GLUCOSE: 94 mg/dL (ref 70–99)
Potassium: 4.2 mEq/L (ref 3.5–5.1)
Sodium: 139 mEq/L (ref 135–145)
TOTAL PROTEIN: 7 g/dL (ref 6.0–8.3)

## 2016-10-23 LAB — LIPID PANEL
Cholesterol: 226 mg/dL — ABNORMAL HIGH (ref 0–200)
HDL: 74.8 mg/dL (ref >39.00)
LDL Cholesterol: 137 mg/dL — ABNORMAL HIGH (ref 0–99)
NONHDL: 150.78
TRIGLYCERIDES: 69 mg/dL (ref 0.0–149.0)
Total CHOL/HDL Ratio: 3
VLDL: 13.8 mg/dL (ref 0.0–40.0)

## 2016-10-23 LAB — VITAMIN D 25 HYDROXY (VIT D DEFICIENCY, FRACTURES): VITD: 56.92 ng/mL (ref 30.00–100.00)

## 2016-10-23 LAB — TSH: TSH: 3.15 u[IU]/mL (ref 0.35–4.50)

## 2016-10-23 NOTE — Telephone Encounter (Signed)
-----   Message from Ellamae Sia sent at 10/21/2016 10:48 AM EDT ----- Regarding: Lab orders for Thursday, 8.9.18 Patient is scheduled for CPX labs, please order future labs, Thanks , Karna Christmas

## 2016-11-04 ENCOUNTER — Other Ambulatory Visit: Payer: PPO

## 2016-11-04 NOTE — Telephone Encounter (Signed)
Scheduled 11/12/16 with PCP only

## 2016-11-12 ENCOUNTER — Ambulatory Visit (INDEPENDENT_AMBULATORY_CARE_PROVIDER_SITE_OTHER): Payer: PPO | Admitting: Family Medicine

## 2016-11-12 ENCOUNTER — Encounter: Payer: Self-pay | Admitting: Family Medicine

## 2016-11-12 VITALS — BP 108/64 | HR 61 | Temp 98.0°F | Ht 62.25 in | Wt 152.5 lb

## 2016-11-12 DIAGNOSIS — E7800 Pure hypercholesterolemia, unspecified: Secondary | ICD-10-CM | POA: Diagnosis not present

## 2016-11-12 DIAGNOSIS — M8580 Other specified disorders of bone density and structure, unspecified site: Secondary | ICD-10-CM

## 2016-11-12 DIAGNOSIS — Z0000 Encounter for general adult medical examination without abnormal findings: Secondary | ICD-10-CM | POA: Diagnosis not present

## 2016-11-12 DIAGNOSIS — Z1231 Encounter for screening mammogram for malignant neoplasm of breast: Secondary | ICD-10-CM | POA: Diagnosis not present

## 2016-11-12 DIAGNOSIS — E559 Vitamin D deficiency, unspecified: Secondary | ICD-10-CM | POA: Diagnosis not present

## 2016-11-12 DIAGNOSIS — D171 Benign lipomatous neoplasm of skin and subcutaneous tissue of trunk: Secondary | ICD-10-CM | POA: Diagnosis not present

## 2016-11-12 HISTORY — DX: Encounter for screening mammogram for malignant neoplasm of breast: Z12.31

## 2016-11-12 MED ORDER — ALENDRONATE SODIUM 70 MG PO TABS: ORAL_TABLET | ORAL | 3 refills | Status: DC

## 2016-11-12 MED ORDER — FLUTICASONE PROPIONATE 50 MCG/ACT NA SUSP: NASAL | 3 refills | Status: DC

## 2016-11-12 NOTE — Patient Instructions (Addendum)
For cholesterol    Avoid red meat/ fried foods/ egg yolks/ fatty breakfast meats/ butter, cheese and high fat dairy/ and shellfish   Fish with fins are good for cholesterol but shell fish are not - that may be why it went up   We will refer you for your mammogram   Don't forget to get a flu shot in the fall   Take care of yourself

## 2016-11-12 NOTE — Progress Notes (Signed)
Subjective:    Patient ID: Kelsey Haley, female    DOB: 1946/02/27, 71 y.o.   MRN: [id number]  HPI Here for health maintenance exam and to review chronic medical problems    Had a busy summer Traveling a lot  Taking good care of herself   Wt Readings from Last 3 Encounters:  11/12/16 152 lb 8 oz (69.2 kg)  10/30/15 144 lb 8 oz (65.5 kg)  08/20/15 145 lb (65.8 kg)  eating more fruit lately ( she really likes it)  Exercise -not enough / less than she used to , now walks and cycles  27.67 kg/m  Mammogram 4/17-nl  Self breast exam - her breasts feel generally lumpy to her   No gyn symptoms   Flu shot - will get in the fall   Tdap 3/13  utd PNA vaccines   zostavax 10/14  dexa 4/17 -osteopenia slightly improved  On year 3 of fosamax  D level is 56 (well controlled with hx of low D in the past) No falls (in the past year)  No fractures  She takes her ca and D   Hyperlipidemia Lab Results  Component Value Date   CHOL 226 (H) 10/23/2016   CHOL 221 (H) 05/16/2015   CHOL 217 (H) 11/15/2014   Lab Results  Component Value Date   HDL 74.80 10/23/2016   HDL 76.60 05/16/2015   HDL 74.80 11/15/2014   Lab Results  Component Value Date   LDLCALC 137 (H) 10/23/2016   LDLCALC 132 (H) 05/16/2015   LDLCALC 130 (H) 11/15/2014   Lab Results  Component Value Date   TRIG 69.0 10/23/2016   TRIG 61.0 05/16/2015   TRIG 58.0 11/15/2014   Lab Results  Component Value Date   CHOLHDL 3 10/23/2016   CHOLHDL 3 05/16/2015   CHOLHDL 3 11/15/2014   Lab Results  Component Value Date   LDLDIRECT 143.0 08/10/2012   LDLDIRECT 142.9 06/02/2011   LDLDIRECT 133.3 01/08/2010   She was taking glucosamine -stopped it  Omega 3 -used to take it  LDL is up slightly - ? Why  She does not eat fried foods  She is eating shellfish   Results for orders placed or performed in visit on 10/23/16  CBC with Differential/Platelet  Result Value Ref Range   WBC 4.7 4.0 - 10.5 K/uL   RBC 3.97  3.87 - 5.11 Mil/uL   Hemoglobin 14.1 12.0 - 15.0 g/dL   HCT 40.3 36.0 - 46.0 %   MCV 101.4 (H) 78.0 - 100.0 fl   MCHC 35.1 30.0 - 36.0 g/dL   RDW 14.0 11.5 - 15.5 %   Platelets 243.0 150.0 - 400.0 K/uL   Neutrophils Relative % 44.1 43.0 - 77.0 %   Lymphocytes Relative 40.5 12.0 - 46.0 %   Monocytes Relative 11.3 3.0 - 12.0 %   Eosinophils Relative 3.7 0.0 - 5.0 %   Basophils Relative 0.4 0.0 - 3.0 %   Neutro Abs 2.1 1.4 - 7.7 K/uL   Lymphs Abs 1.9 0.7 - 4.0 K/uL   Monocytes Absolute 0.5 0.1 - 1.0 K/uL   Eosinophils Absolute 0.2 0.0 - 0.7 K/uL   Basophils Absolute 0.0 0.0 - 0.1 K/uL  Comprehensive metabolic panel  Result Value Ref Range   Sodium 139 135 - 145 mEq/L   Potassium 4.2 3.5 - 5.1 mEq/L   Chloride 103 96 - 112 mEq/L   CO2 32 19 - 32 mEq/L   Glucose, Bld 94 70 -  99 mg/dL   BUN 17 6 - 23 mg/dL   Creatinine, Ser 0.74 0.40 - 1.20 mg/dL   Total Bilirubin 0.6 0.2 - 1.2 mg/dL   Alkaline Phosphatase 39 39 - 117 U/L   AST 19 0 - 37 U/L   ALT 15 0 - 35 U/L   Total Protein 7.0 6.0 - 8.3 g/dL   Albumin 4.2 3.5 - 5.2 g/dL   Calcium 9.3 8.4 - 10.5 mg/dL   GFR 82.31 >60.00 mL/min  Lipid panel  Result Value Ref Range   Cholesterol 226 (H) 0 - 200 mg/dL   Triglycerides 69.0 0.0 - 149.0 mg/dL   HDL 74.80 >39.00 mg/dL   VLDL 13.8 0.0 - 40.0 mg/dL   LDL Cholesterol 137 (H) 0 - 99 mg/dL   Total CHOL/HDL Ratio 3    NonHDL 150.78   TSH  Result Value Ref Range   TSH 3.15 0.35 - 4.50 uIU/mL  VITAMIN D 25 Hydroxy (Vit-D Deficiency, Fractures)  Result Value Ref Range   VITD 56.92 30.00 - 100.00 ng/mL      Patient Active Problem List   Diagnosis Date Noted  . Screening mammogram, encounter for 11/12/2016  . Right hand pain 10/30/2015  . Neck pain 10/30/2015  . Left foot pain 10/30/2015  . Encounter for Medicare annual wellness exam 05/16/2015  . Stress headaches 05/16/2015  . Estrogen deficiency 05/16/2015  . Need for hepatitis C screening test 05/16/2015  . Rash and  nonspecific skin eruption 05/16/2015  . Lipoma 12/20/2014  . Bee sting reaction 10/23/2014  . Encounter for routine gynecological examination 08/16/2012  . Routine general medical examination at a health care facility 08/10/2012  . Other screening mammogram 06/02/2011  . Vitamin D deficiency 01/08/2010  . HYPERCHOLESTEROLEMIA 01/08/2010  . COLONIC POLYPS 10/01/2009  . Hearing loss 11/22/2008  . INTERMITTENT VERTIGO 11/22/2008  . Osteopenia 01/27/2007   Past Medical History:  Diagnosis Date  . Headache    occasional migranes  . Hearing loss   . Osteopenia   . Vertigo    Past Surgical History:  Procedure Laterality Date  . COLONOSCOPY WITH PROPOFOL N/A 08/20/2015   Procedure: COLONOSCOPY WITH PROPOFOL;  Surgeon: Manya Silvas, MD;  Location: [facility] ENDOSCOPY;  Service: Endoscopy;  Laterality: N/A;  . left knee surgery    . TONSILLECTOMY     Social History  Substance Use Topics  . Smoking status: Former Smoker    Quit date: 03/17/1994  . Smokeless tobacco: Never Used     Comment: was a light smoker before quitting  . Alcohol use 0.0 oz/week     Comment: occ-wine   Family History  Problem Relation Age of Onset  . Breast cancer Neg Hx    No Known Allergies Current Outpatient Prescriptions on File Prior to Visit  Medication Sig Dispense Refill  . Ascorbic Acid (VITAMIN C) 1000 MG tablet Take 1,000 mg by mouth daily.    . meclizine (ANTIVERT) 25 MG tablet TAKE 1 TO 2 TABLETS BY MOUTH EVERY 8 HOURS AS NEEDED FOR DIZZINESS OR VERTIGO 30 tablet 3  . triamcinolone cream (KENALOG) 0.1 % Apply 1 application topically daily as needed. To affected areas 15 g 3   No current facility-administered medications on file prior to visit.     Review of Systems Review of Systems  Constitutional: Negative for fever, appetite change, fatigue and unexpected weight change.  Eyes: Negative for pain and visual disturbance.  Respiratory: Negative for cough and shortness of breath.  Cardiovascular: Negative for cp or palpitations    Gastrointestinal: Negative for nausea, diarrhea and constipation.  Genitourinary: Negative for urgency and frequency.  Skin: Negative for pallor or rash   Neurological: Negative for weakness, light-headedness, numbness and headaches.  Hematological: Negative for adenopathy. Does not bruise/bleed easily.  Psychiatric/Behavioral: Negative for dysphoric mood. The patient is not nervous/anxious.         Objective:   Physical Exam  Constitutional: She appears well-developed and well-nourished. No distress.  Well appearing   HENT:  Head: Normocephalic and atraumatic.  Right Ear: External ear normal.  Left Ear: External ear normal.  Mouth/Throat: Oropharynx is clear and moist.  Eyes: Pupils are equal, round, and reactive to light. Conjunctivae and EOM are normal. No scleral icterus.  Neck: Normal range of motion. Neck supple. No JVD present. Carotid bruit is not present. No thyromegaly present.  Cardiovascular: Normal rate, regular rhythm, normal heart sounds and intact distal pulses.  Exam reveals no gallop.   Pulmonary/Chest: Effort normal and breath sounds normal. No respiratory distress. She has no wheezes. She exhibits no tenderness.  Abdominal: Soft. Bowel sounds are normal. She exhibits no distension, no abdominal bruit and no mass. There is no tenderness.  Genitourinary: No breast swelling, tenderness, discharge or bleeding.  Genitourinary Comments: Breast exam: No mass, nodules, thickening, tenderness, bulging, retraction, inflamation, nipple discharge or skin changes noted.  No axillary or clavicular LA.     Dense tissue   Musculoskeletal: Normal range of motion. She exhibits no edema or tenderness.  No kyphosis   Lymphadenopathy:    She has no cervical adenopathy.  Neurological: She is alert. She has normal reflexes. No cranial nerve deficit. She exhibits normal muscle tone. Coordination normal.  Skin: Skin is warm and dry. No  rash noted. No erythema. No pallor.  Stable large lipoma L back   Olive complexion  Few nevi on trunk   Psychiatric: She has a normal mood and affect.          Assessment & Plan:   Problem List Items Addressed This Visit      Musculoskeletal and Integument   Osteopenia    Rev dexa 4/17 Year 3 of fosamax No falls or fx and D level is tx On ca and D  Due dexa 4/19 or later        Other   HYPERCHOLESTEROLEMIA    Disc goals for lipids and reasons to control them Rev labs with pt Rev low sat fat diet in detail Diet handout given       Lipoma    Stable Does not bother pt  Will continue to obs      Routine general medical examination at a health care facility - Primary    Reviewed health habits including diet and exercise and skin cancer prevention Reviewed appropriate screening tests for age  Also reviewed health mt list, fam hx and immunization status , as well as social and family history   See HPI Labs rev  Ref for mammogram  Will get flu shot in the fall  Enc healthy diet and exercise  rec low cholesterol diet       Screening mammogram, encounter for    Scheduled annual screening mammogram Nl breast exam today  Encouraged monthly self exams        Relevant Orders   MM DIGITAL SCREENING BILATERAL   Vitamin D deficiency    Vitamin D level is therapeutic with current supplementation Disc importance of this to bone and  overall health Level in 4s

## 2016-11-13 NOTE — Assessment & Plan Note (Signed)
Stable Does not bother pt  Will continue to obs

## 2016-11-13 NOTE — Assessment & Plan Note (Signed)
Disc goals for lipids and reasons to control them Rev labs with pt Rev low sat fat diet in detail Diet handout given

## 2016-11-13 NOTE — Assessment & Plan Note (Signed)
Scheduled annual screening mammogram Nl breast exam today  Encouraged monthly self exams   

## 2016-11-13 NOTE — Assessment & Plan Note (Signed)
Vitamin D level is therapeutic with current supplementation Disc importance of this to bone and overall health Level in 50s 

## 2016-11-13 NOTE — Assessment & Plan Note (Signed)
Reviewed health habits including diet and exercise and skin cancer prevention Reviewed appropriate screening tests for age  Also reviewed health mt list, fam hx and immunization status , as well as social and family history   See HPI Labs rev  Ref for mammogram  Will get flu shot in the fall  Enc healthy diet and exercise  rec low cholesterol diet

## 2016-11-13 NOTE — Assessment & Plan Note (Signed)
Rev dexa 4/17 Year 3 of fosamax No falls or fx and D level is tx On ca and D  Due dexa 4/19 or later

## 2016-11-24 ENCOUNTER — Ambulatory Visit
Admission: RE | Admit: 2016-11-24 | Discharge: 2016-11-24 | Disposition: A | Discharge status: Home / Self Care | Payer: PPO | Source: Ambulatory Visit | Attending: Family Medicine | Admitting: Family Medicine

## 2016-11-24 DIAGNOSIS — Z1231 Encounter for screening mammogram for malignant neoplasm of breast: Secondary | ICD-10-CM | POA: Diagnosis not present

## 2017-02-11 ENCOUNTER — Ambulatory Visit: Payer: PPO | Admitting: Family Medicine

## 2017-02-11 ENCOUNTER — Encounter: Payer: Self-pay | Admitting: Family Medicine

## 2017-02-11 VITALS — BP 98/50 | HR 63 | Temp 97.3°F | Wt 154.0 lb

## 2017-02-11 DIAGNOSIS — R21 Rash and other nonspecific skin eruption: Secondary | ICD-10-CM | POA: Diagnosis not present

## 2017-02-11 MED ORDER — TRIAMCINOLONE ACETONIDE 0.1 % EX CREA: 1.0000 "application " | TOPICAL_CREAM | Freq: Two times a day (BID) | CUTANEOUS | 3 refills | Status: DC

## 2017-02-11 NOTE — Progress Notes (Signed)
   Subjective:    Patient ID: Kelsey Haley, female    DOB: 1945-10-15, 71 y.o.   MRN: [id number]  HPI This is a 71 yo female who presents today with a rash x 1 month. It is on her back and is itchy. She has not had any new soaps/shampoos/lotion/detergents. Has tried antifungal cream x 3 weeks and OTC hydrocortisone cream without improvement.  Has history of eczema on her face about 10 years ago.   Past Medical History:  Diagnosis Date  . Headache    occasional migranes  . Hearing loss   . Osteopenia   . Vertigo    Past Surgical History:  Procedure Laterality Date  . COLONOSCOPY WITH PROPOFOL N/A 08/20/2015   Procedure: COLONOSCOPY WITH PROPOFOL;  Surgeon: Manya Silvas, MD;  Location: [facility] ENDOSCOPY;  Service: Endoscopy;  Laterality: N/A;  . left knee surgery    . TONSILLECTOMY     Family History  Problem Relation Age of Onset  . Breast cancer Neg Hx    Social History   Tobacco Use  . Smoking status: Former Smoker    Last attempt to quit: 03/17/1994    Years since quitting: 22.9  . Smokeless tobacco: Never Used  . Tobacco comment: was a light smoker before quitting  Substance Use Topics  . Alcohol use: Yes    Alcohol/week: 0.0 oz    Comment: occ-wine  . Drug use: No      Review of Systems Per HPI    Objective:   Physical Exam  Constitutional: She appears well-developed and well-nourished. No distress.  Eyes: Conjunctivae are normal.  Cardiovascular: Normal rate.  Pulmonary/Chest: Effort normal.  Skin: Skin is warm and dry. She is not diaphoretic.  Large area to right of midline with mildly increased pigmentation. No erythema, area is flat.   Psychiatric: She has a normal mood and affect. Her behavior is normal.  Vitals reviewed.     BP (!) 98/50 (BP Location: Right Arm, Patient Position: Sitting, Cuff Size: Normal)   Pulse 63   Temp (!) 97.3 F (36.3 C) (Oral)   Wt 154 lb (69.9 kg)   SpO2 98%   BMI 27.94 kg/m  Wt Readings from Last 3 Encounters:    02/11/17 154 lb (69.9 kg)  11/12/16 152 lb 8 oz (69.2 kg)  10/30/15 144 lb 8 oz (65.5 kg)       Assessment & Plan:  1. Rash of back - she has dermatologist and will follow up if no improvment - triamcinolone cream (KENALOG) 0.1 %; Apply 1 application topically 2 (two) times daily. To affected areas for no more than 10 days  Dispense: 45 g; Refill: 3   Clarene Reamer, FNP-BC   Primary Care at [facility], [place]  02/11/2017 11:47 AM

## 2017-02-11 NOTE — Patient Instructions (Signed)
I have sent your prescription to your pharmacy  If not better in 10 days, stop medication and see your dermatologist

## 2017-04-20 ENCOUNTER — Encounter: Payer: Self-pay | Admitting: Family Medicine

## 2017-04-20 ENCOUNTER — Ambulatory Visit (INDEPENDENT_AMBULATORY_CARE_PROVIDER_SITE_OTHER): Payer: PPO | Admitting: Family Medicine

## 2017-04-20 VITALS — BP 134/84 | HR 65 | Temp 98.1°F | Ht 62.25 in | Wt 150.8 lb

## 2017-04-20 DIAGNOSIS — R21 Rash and other nonspecific skin eruption: Secondary | ICD-10-CM

## 2017-04-20 DIAGNOSIS — Z8049 Family history of malignant neoplasm of other genital organs: Secondary | ICD-10-CM

## 2017-04-20 HISTORY — DX: Family history of malignant neoplasm of other genital organs: Z80.49

## 2017-04-20 NOTE — Assessment & Plan Note (Signed)
New hx of uterine cancer (not cervical) in her sister (in [place])  ? HRT, was obese  Pt herself has had no bleeding/spotting/pain or other symptoms  Bimanual exam is nl today

## 2017-04-20 NOTE — Patient Instructions (Addendum)
Stay cool (hot conditions and hot water make itch worse)  Avoid perfumes and chemicals  Our office will work on a dermatology referral and call you

## 2017-04-20 NOTE — Assessment & Plan Note (Addendum)
Now a hyperpigmented area on R mid back (oval in shape) with scant scale  Not resp to steroid creams or anti fungals  Had triamcinolone 0.1% and then something else stronger a doctor friend gave her  Will ref to derm (urgen) as she leaves the country at the end of the month- Disc use of gentle cleansers/ avoid hot water  Avoid harsh detergents and perfumes

## 2017-04-20 NOTE — Progress Notes (Signed)
Subjective:    Patient ID: Kelsey Haley, female    DOB: [birth year], 72 y.o.   MRN: [id number]  HPI Here for a rash on her back   Overall started as mild itch about a year ago-then gradually worsened   Was seen on 11/28 by NP Carlean Purl  Px triamcinolone 0.1% for bid use and adv to see derm if not improved   She has an appt with dermatology in April (she will be out of the country in march) Then she used a stronger cream (steroid) from a friend who is a doctor  It also did not help   Itching is bad Has to take benadryl at night-cannot sleep due to itching  The triamcinolone is not helping much   Area is the same -not bigger   Flu vaccine   Sister had uterine cancer -wanted to let me know   Patient Active Problem List   Diagnosis Date Noted  . Family history of uterine cancer 04/20/2017  . Screening mammogram, encounter for 11/12/2016  . Encounter for Medicare annual wellness exam 05/16/2015  . Stress headaches 05/16/2015  . Estrogen deficiency 05/16/2015  . Rash and nonspecific skin eruption 05/16/2015  . Lipoma 12/20/2014  . Encounter for routine gynecological examination 08/16/2012  . Routine general medical examination at a health care facility 08/10/2012  . Vitamin D deficiency 01/08/2010  . HYPERCHOLESTEROLEMIA 01/08/2010  . COLONIC POLYPS 10/01/2009  . Hearing loss 11/22/2008  . INTERMITTENT VERTIGO 11/22/2008  . Osteopenia 01/27/2007   Past Medical History:  Diagnosis Date  . Headache    occasional migranes  . Hearing loss   . Osteopenia   . Vertigo    Past Surgical History:  Procedure Laterality Date  . COLONOSCOPY WITH PROPOFOL N/A 08/20/2015   Procedure: COLONOSCOPY WITH PROPOFOL;  Surgeon: Manya Silvas, MD;  Location: [facility] ENDOSCOPY;  Service: Endoscopy;  Laterality: N/A;  . left knee surgery    . TONSILLECTOMY     Social History   Tobacco Use  . Smoking status: Former Smoker    Last attempt to quit: 03/17/1994    Years since quitting: 23.1    . Smokeless tobacco: Never Used  . Tobacco comment: was a light smoker before quitting  Substance Use Topics  . Alcohol use: Yes    Alcohol/week: 0.0 oz    Comment: occ-wine  . Drug use: No   Family History  Problem Relation Age of Onset  . Uterine cancer Sister   . Breast cancer Neg Hx    No Known Allergies Current Outpatient Medications on File Prior to Visit  Medication Sig Dispense Refill  . alendronate (FOSAMAX) 70 MG tablet TAKE 1 TABLET BY MOUTH 1 TIME A WEEK ON AN EMPTY STOMACH AND WITH A FULL GLASS OF WATER 12 tablet 3  . Ascorbic Acid (VITAMIN C) 1000 MG tablet Take 1,000 mg by mouth daily.    Marland Kitchen b complex vitamins capsule Take 1 capsule by mouth daily.    . Calcium-Magnesium-Zinc 500-250-12.5 MG TABS Take 2 tablets by mouth daily.    . Cholecalciferol (VITAMIN D-3) 5000 units TABS Take 1 tablet by mouth daily.    . fluticasone (FLONASE) 50 MCG/ACT nasal spray SHAKE LIQUID AND USE 2 SPRAYS IN EACH NOSTRIL DAILY 16 g 3  . KOREAN GINSENG PO Take 1 tablet by mouth daily.    . meclizine (ANTIVERT) 25 MG tablet TAKE 1 TO 2 TABLETS BY MOUTH EVERY 8 HOURS AS NEEDED FOR DIZZINESS OR VERTIGO 30 tablet  3  . Potassium Gluconate 550 MG TABS Take 1 tablet by mouth daily.    Marland Kitchen triamcinolone cream (KENALOG) 0.1 % Apply 1 application topically 2 (two) times daily. To affected areas for no more than 10 days 45 g 3   No current facility-administered medications on file prior to visit.     Review of Systems  Constitutional: Negative for activity change, appetite change, fatigue, fever and unexpected weight change.  HENT: Negative for congestion, ear pain, rhinorrhea, sinus pressure and sore throat.   Eyes: Negative for pain, redness and visual disturbance.  Respiratory: Negative for cough, shortness of breath and wheezing.   Cardiovascular: Negative for chest pain and palpitations.  Gastrointestinal: Negative for abdominal pain, blood in stool, constipation and diarrhea.  Endocrine:  Negative for polydipsia and polyuria.  Genitourinary: Negative for dyspareunia, dysuria, flank pain, frequency, genital sores, hematuria, menstrual problem, pelvic pain, urgency, vaginal bleeding, vaginal discharge and vaginal pain.  Musculoskeletal: Negative for arthralgias, back pain and myalgias.  Skin: Positive for rash. Negative for pallor.  Allergic/Immunologic: Negative for environmental allergies.  Neurological: Negative for dizziness, syncope and headaches.  Hematological: Negative for adenopathy. Does not bruise/bleed easily.  Psychiatric/Behavioral: Negative for decreased concentration and dysphoric mood. The patient is not nervous/anxious.        Objective:   Physical Exam  Constitutional: She appears well-developed and well-nourished. No distress.  Well appearing   HENT:  Head: Normocephalic and atraumatic.  Mouth/Throat: Oropharynx is clear and moist.  Eyes: Conjunctivae and EOM are normal. Pupils are equal, round, and reactive to light. No scleral icterus.  Neck: Normal range of motion. Neck supple.  Cardiovascular: Normal rate, regular rhythm and normal heart sounds.  Pulmonary/Chest: Effort normal and breath sounds normal. No respiratory distress. She has no wheezes.  Genitourinary:  Genitourinary Comments:         Anus appears normal w/o hemorrhoids or masses     External genitalia : nl appearance and hair distribution/no lesions     Urethral meatus : nl size, no lesions or prolapse     Urethra: no masses, tenderness or scarring    Bladder : no masses or tenderness     Vagina: nl general appearance, no discharge or  Lesions, no significant cystocele  or rectocele     Cervix: no CMT    Uterus: nl size, contour, position, and mobility (not fixed) , non tender    Adnexa : no masses, tenderness, enlargement or nodularity        Musculoskeletal: She exhibits no edema.  Lymphadenopathy:    She has no cervical adenopathy.  Neurological: She is  alert.  Skin: Skin is warm and dry. Rash noted. No erythema. No pallor.  Oval area of hyperpigmented skin with scant scale but no excoriations just to the R of spine  If raised-very slighty (not resembling a plaque)   No other rash or skin changes     Psychiatric: She has a normal mood and affect.          Assessment & Plan:   Problem List Items Addressed This Visit      Musculoskeletal and Integument   Rash and nonspecific skin eruption - Primary    Now a hyperpigmented area on R mid back (oval in shape) with scant scale  Not resp to steroid creams or anti fungals  Had triamcinolone 0.1% and then something else stronger a doctor friend gave her  Will ref to derm (urgen) as she leaves the country at the end  of the month- Disc use of gentle cleansers/ avoid hot water  Avoid harsh detergents and perfumes        Relevant Orders   Ambulatory referral to Dermatology     Other   Family history of uterine cancer    New hx of uterine cancer (not cervical) in her sister (in [place])  ? HRT, was obese  Pt herself has had no bleeding/spotting/pain or other symptoms  Bimanual exam is nl today

## 2017-04-22 ENCOUNTER — Other Ambulatory Visit: Payer: Self-pay | Admitting: Family Medicine

## 2017-04-22 DIAGNOSIS — R202 Paresthesia of skin: Secondary | ICD-10-CM | POA: Diagnosis not present

## 2017-04-22 DIAGNOSIS — D485 Neoplasm of uncertain behavior of skin: Secondary | ICD-10-CM | POA: Diagnosis not present

## 2017-04-22 DIAGNOSIS — D225 Melanocytic nevi of trunk: Secondary | ICD-10-CM | POA: Diagnosis not present

## 2017-04-22 DIAGNOSIS — D2262 Melanocytic nevi of left upper limb, including shoulder: Secondary | ICD-10-CM | POA: Diagnosis not present

## 2017-04-29 ENCOUNTER — Encounter: Payer: Self-pay | Admitting: Surgery

## 2017-04-29 ENCOUNTER — Ambulatory Visit (INDEPENDENT_AMBULATORY_CARE_PROVIDER_SITE_OTHER): Payer: PPO | Admitting: Surgery

## 2017-04-29 VITALS — BP 123/73 | HR 68 | Temp 97.4°F | Ht 62.4 in | Wt 154.8 lb

## 2017-04-29 DIAGNOSIS — D1721 Benign lipomatous neoplasm of skin and subcutaneous tissue of right arm: Secondary | ICD-10-CM | POA: Diagnosis not present

## 2017-04-29 DIAGNOSIS — D171 Benign lipomatous neoplasm of skin and subcutaneous tissue of trunk: Secondary | ICD-10-CM

## 2017-04-29 NOTE — Progress Notes (Signed)
04/29/2017  Reason for Visit:  Back lipoma  Referring Provider:  Dr. Marni Griffon  History of Present Illness: Kelsey Haley is a 72 y.o. female who presents with a left back lipoma.  The patient noticed this first about two years ago and has been monitoring it since.  She does not think that it has grown in size and her husband tells her it's the same size as well.  Denies any symptoms from it and has no pain or nerve issues from it.  She also has a very small one on the right forearm.  She had a rash on her back and saw a dermatologist, and the dermatologist referred her to [place] for her lipoma.  Denies any fevers or chills, erythema, redness, pain, or other symptoms.  Past Medical History: Past Medical History:  Diagnosis Date  . Encounter for Medicare annual wellness exam 05/16/2015  . Estrogen deficiency 05/16/2015  . Family history of uterine cancer 04/20/2017  . Headache    occasional migranes  . Hearing loss   . Hearing loss 11/22/2008   Qualifier: Diagnosis of  By: Glori Bickers MD, Carmell Austria   . HYPERCHOLESTEROLEMIA 01/08/2010   Qualifier: Diagnosis of  By: Joesph Fillers CMA (AAMA), Ronny Bacon    . INTERMITTENT VERTIGO 11/22/2008   Qualifier: Diagnosis of  By: Glori Bickers MD, Carmell Austria   . Lipoma 12/20/2014   On L back   . Osteopenia   . Rash and nonspecific skin eruption 05/16/2015   Atopic? -occ neck or L hand  Uses triamcinolone   . Screening mammogram, encounter for 11/12/2016  . Stress headaches 05/16/2015  . Vertigo   . Vitamin D deficiency 01/08/2010   Qualifier: Diagnosis of  By: Joesph Fillers CMA (AAMA), Ronny Bacon       Past Surgical History: Past Surgical History:  Procedure Laterality Date  . COLONOSCOPY WITH PROPOFOL N/A 08/20/2015   Procedure: COLONOSCOPY WITH PROPOFOL;  Surgeon: Manya Silvas, MD;  Location: [facility] ENDOSCOPY;  Service: Endoscopy;  Laterality: N/A;  . left knee surgery    . TONSILLECTOMY      Home Medications: Prior to Admission medications   Medication Sig Start Date End Date  Taking? Authorizing Provider  alendronate (FOSAMAX) 70 MG tablet TAKE 1 TABLET BY MOUTH 1 TIME A WEEK ON AN EMPTY STOMACH AND WITH A FULL GLASS OF WATER 11/12/16  Yes Tower, Wynelle Fanny, MD  Ascorbic Acid (VITAMIN C) 1000 MG tablet Take 1,000 mg by mouth daily.   Yes [provider]  b complex vitamins capsule Take 1 capsule by mouth daily.   Yes [provider]  Calcium-Magnesium-Zinc 500-250-12.5 MG TABS Take 2 tablets by mouth daily.   Yes [provider]  Cholecalciferol (VITAMIN D-3) 5000 units TABS Take 1 tablet by mouth daily.   Yes [provider]  fluticasone (FLONASE) 50 MCG/ACT nasal spray SHAKE LIQUID AND USE 2 SPRAYS IN EACH NOSTRIL DAILY 04/22/17  Yes Tower, Wynelle Fanny, MD  KOREAN GINSENG PO Take 1 tablet by mouth daily.   Yes [provider]  meclizine (ANTIVERT) 25 MG tablet TAKE 1 TO 2 TABLETS BY MOUTH EVERY 8 HOURS AS NEEDED FOR DIZZINESS OR VERTIGO 10/06/16  Yes Tower, Marne A, MD  Potassium Gluconate 550 MG TABS Take 1 tablet by mouth daily.   Yes [provider]  triamcinolone cream (KENALOG) 0.1 % Apply 1 application topically 2 (two) times daily. To affected areas for no more than 10 days 02/11/17  Yes Elby Beck, FNP  Allergies: No Known Allergies  Social History:  reports that she quit smoking about 23 years ago. she has never used smokeless tobacco. She reports that she drinks alcohol. She reports that she does not use drugs.   Family History: Family History  Problem Relation Age of Onset  . Uterine cancer Sister   . Prostate cancer Father   . Prostate cancer Brother   . Breast cancer Neg Hx     Review of Systems: Review of Systems  Constitutional: Negative for chills and fever.  HENT: Negative for hearing loss.   Respiratory: Negative for shortness of breath.   Cardiovascular: Negative for chest pain.  Gastrointestinal: Negative for abdominal pain, nausea and vomiting.  Musculoskeletal: Negative for  back pain.  Skin: Positive for rash.  Neurological: Negative for dizziness.  Psychiatric/Behavioral: Negative for depression.  All other systems reviewed and are negative.   Physical Exam BP 123/73   Pulse 68   Temp (!) 97.4 F (36.3 C) (Oral)   Ht 5' 2.4" (1.585 m)   Wt 70.2 kg (154 lb 12.8 oz)   BMI 27.95 kg/m  CONSTITUTIONAL: No acute distress HEENT:  Normocephalic, atraumatic, extraocular motion intact. NECK: Trachea is midline, and there is no jugular venous distension. RESPIRATORY:  Lungs are clear, and breath sounds are equal bilaterally. Normal respiratory effort without pathologic use of accessory muscles. CARDIOVASCULAR: Heart is regular without murmurs, gallops, or rubs. GI: The abdomen is soft, nondistended, nontender.  MUSCULOSKELETAL:  Normal muscle strength and tone in all four extremities.  No peripheral edema or cyanosis. SKIN:  The patient has an approximately 6 cm lipoma on the left back, anterior to the scapula.  The scapula hides it when her arm is adducted, and the mass bulges out when her arm is abducted.  There is no tenderness to palpation.  The mass is soft, a bit mobile, without any pain.  The right dorsal forearm has a very small 2 cm mass also that also is mobile, nontender. NEUROLOGIC:  Motor and sensation is grossly normal.  Cranial nerves are grossly intact. PSYCH:  Alert and oriented to person, place and time. Affect is normal.  Laboratory Analysis: No results found for this or any previous visit (from the past 24 hour(s)).  Imaging: No results found.  Assessment and Plan: This is a 72 y.o. female who presents with a lipoma of the left back as well as the right dorsal forearm.   Discussed with the patient that these lipomas are benign and very rarely is there a concern for malignancy.  Given that they are asymptomatic and have not been growing in size, there is really no particular urgency to resect them.  Discussed with the patient that typically  patients would want resection for cosmetic issues, pain issues, or for peace of mind.  The patient at this point does not have any particular concern with these lipomas and does not wish to seek any surgical management.  Discussed with the patient that for now, we'll do watchful waiting.  She should continue monitoring them herself and watching for any sudden rapid growth or any worsening symptoms.  She is free to call us anytime if any issues or concerns and we'll be happy to see her in the future.  Face-to-face time spent with the patient and care providers was 60 minutes, with more than 50% of the time spent counseling, educating, and coordinating care of the patient.     Melvyn Neth, [place]

## 2017-04-29 NOTE — Patient Instructions (Signed)
Please continue to monitor the Lipomas. If they become bothersome let us know.  Please call our office if you have any questions or concerns.   Lipoma A lipoma is a noncancerous (benign) tumor that is made up of fat cells. This is a very common type of soft-tissue growth. Lipomas are usually found under the skin (subcutaneous). They may occur in any tissue of the body that contains fat. Common areas for lipomas to appear include the back, shoulders, buttocks, and thighs. Lipomas grow slowly, and they are usually painless. Most lipomas do not cause problems and do not require treatment. What are the causes? The cause of this condition is not known. What increases the risk? This condition is more likely to develop in:  People who are 29-60 years old.  People who have a family history of lipomas.  What are the signs or symptoms? A lipoma usually appears as a small, round bump under the skin. It may feel soft or rubbery, but the firmness can vary. Most lipomas are not painful. However, a lipoma may become painful if it is located in an area where it pushes on nerves. How is this diagnosed? A lipoma can usually be diagnosed with a physical exam. You may also have tests to confirm the diagnosis and to rule out other conditions. Tests may include:  Imaging tests, such as a CT scan or MRI.  Removal of a tissue sample to be looked at under a microscope (biopsy).  How is this treated? Treatment is not needed for small lipomas that are not causing problems. If a lipoma continues to get bigger or it causes problems, removal is often the best option. Lipomas can also be removed to improve appearance. Removal of a lipoma is usually done with a surgery in which the fatty cells and the surrounding capsule are removed. Most often, a medicine that numbs the area (local anesthetic) is used for this procedure. Follow these instructions at home:  Keep all follow-up visits as directed by your health care  provider. This is important. Contact a health care provider if:  Your lipoma becomes larger or hard.  Your lipoma becomes painful, red, or increasingly swollen. These could be signs of infection or a more serious condition. This information is not intended to replace advice given to you by your health care provider. Make sure you discuss any questions you have with your health care provider. Document Released: 02/21/2002 Document Revised: 08/09/2015 Document Reviewed: 02/27/2014 Elsevier Interactive Patient Education  [employer].

## 2017-05-07 DIAGNOSIS — H25813 Combined forms of age-related cataract, bilateral: Secondary | ICD-10-CM | POA: Diagnosis not present

## 2017-05-12 ENCOUNTER — Ambulatory Visit: Payer: Self-pay | Admitting: General Surgery

## 2017-06-30 ENCOUNTER — Other Ambulatory Visit: Payer: Self-pay | Admitting: Family Medicine

## 2017-07-01 NOTE — Telephone Encounter (Signed)
done

## 2017-07-01 NOTE — Telephone Encounter (Signed)
She had been on and off of it - so please refill for a year  Thanks

## 2017-07-01 NOTE — Telephone Encounter (Signed)
1st refill was 03/20/11 (over 52yrs) please advise

## 2017-11-18 ENCOUNTER — Other Ambulatory Visit: Payer: Self-pay | Admitting: Family Medicine

## 2017-11-18 DIAGNOSIS — Z1231 Encounter for screening mammogram for malignant neoplasm of breast: Secondary | ICD-10-CM

## 2017-11-19 ENCOUNTER — Other Ambulatory Visit: Payer: Self-pay | Admitting: Family Medicine

## 2017-11-20 NOTE — Telephone Encounter (Signed)
CPE scheduled for 11/30/17, last filled on 10/06/16 #30 tabs with 3 refills

## 2017-11-24 ENCOUNTER — Ambulatory Visit: Payer: PPO

## 2017-11-30 ENCOUNTER — Encounter: Payer: PPO | Admitting: Family Medicine

## 2017-12-14 ENCOUNTER — Other Ambulatory Visit: Payer: Self-pay | Admitting: Family Medicine

## 2017-12-14 NOTE — Telephone Encounter (Signed)
1st refill was 03/21/11 (over 72yrs), please advise

## 2017-12-14 NOTE — Telephone Encounter (Signed)
Will continue until PE in Nov and disc then

## 2017-12-15 ENCOUNTER — Ambulatory Visit
Admission: RE | Admit: 2017-12-15 | Discharge: 2017-12-15 | Disposition: A | Discharge status: Home / Self Care | Payer: PPO | Source: Ambulatory Visit | Attending: Family Medicine | Admitting: Family Medicine

## 2017-12-15 DIAGNOSIS — Z1231 Encounter for screening mammogram for malignant neoplasm of breast: Secondary | ICD-10-CM

## 2018-02-09 ENCOUNTER — Encounter: Payer: Self-pay | Admitting: Family Medicine

## 2018-02-09 ENCOUNTER — Telehealth: Payer: Self-pay | Admitting: *Deleted

## 2018-02-09 ENCOUNTER — Ambulatory Visit (INDEPENDENT_AMBULATORY_CARE_PROVIDER_SITE_OTHER): Payer: PPO | Admitting: Family Medicine

## 2018-02-09 VITALS — BP 124/80 | HR 61 | Temp 98.2°F | Ht 62.75 in | Wt 146.5 lb

## 2018-02-09 DIAGNOSIS — H269 Unspecified cataract: Secondary | ICD-10-CM | POA: Insufficient documentation

## 2018-02-09 DIAGNOSIS — Z0000 Encounter for general adult medical examination without abnormal findings: Secondary | ICD-10-CM | POA: Diagnosis not present

## 2018-02-09 DIAGNOSIS — E559 Vitamin D deficiency, unspecified: Secondary | ICD-10-CM

## 2018-02-09 DIAGNOSIS — E2839 Other primary ovarian failure: Secondary | ICD-10-CM | POA: Diagnosis not present

## 2018-02-09 DIAGNOSIS — M8580 Other specified disorders of bone density and structure, unspecified site: Secondary | ICD-10-CM | POA: Diagnosis not present

## 2018-02-09 DIAGNOSIS — E7800 Pure hypercholesterolemia, unspecified: Secondary | ICD-10-CM | POA: Diagnosis not present

## 2018-02-09 LAB — CBC WITH DIFFERENTIAL/PLATELET
Basophils Absolute: 0 10*3/uL (ref 0.0–0.1)
Basophils Relative: 0.7 % (ref 0.0–3.0)
EOS ABS: 0 10*3/uL (ref 0.0–0.7)
EOS PCT: 0.3 % (ref 0.0–5.0)
HCT: 43.7 % (ref 36.0–46.0)
Hemoglobin: 14.6 g/dL (ref 12.0–15.0)
Lymphocytes Relative: 37.2 % (ref 12.0–46.0)
Lymphs Abs: 1.4 10*3/uL (ref 0.7–4.0)
MCHC: 33.5 g/dL (ref 30.0–36.0)
MCV: 94.8 fl (ref 78.0–100.0)
MONO ABS: 0.8 10*3/uL (ref 0.1–1.0)
Monocytes Relative: 22.1 % — ABNORMAL HIGH (ref 3.0–12.0)
Neutro Abs: 1.5 10*3/uL (ref 1.4–7.7)
Neutrophils Relative %: 39.7 % — ABNORMAL LOW (ref 43.0–77.0)
Platelets: 255 10*3/uL (ref 150.0–400.0)
RBC: 4.6 Mil/uL (ref 3.87–5.11)
RDW: 13.6 % (ref 11.5–15.5)
WBC: 3.8 10*3/uL — AB (ref 4.0–10.5)

## 2018-02-09 LAB — COMPREHENSIVE METABOLIC PANEL
ALK PHOS: 47 U/L (ref 39–117)
ALT: 16 U/L (ref 0–35)
AST: 22 U/L (ref 0–37)
Albumin: 4.6 g/dL (ref 3.5–5.2)
BILIRUBIN TOTAL: 0.6 mg/dL (ref 0.2–1.2)
BUN: 20 mg/dL (ref 6–23)
CO2: 27 meq/L (ref 19–32)
Calcium: 9.6 mg/dL (ref 8.4–10.5)
Chloride: 102 mEq/L (ref 96–112)
Creatinine, Ser: 0.82 mg/dL (ref 0.40–1.20)
GFR: 72.85 mL/min (ref >60.00)
Glucose, Bld: 105 mg/dL — ABNORMAL HIGH (ref 70–99)
Potassium: 4.2 mEq/L (ref 3.5–5.1)
SODIUM: 138 meq/L (ref 135–145)
TOTAL PROTEIN: 7.7 g/dL (ref 6.0–8.3)

## 2018-02-09 LAB — TSH: TSH: 1.2 u[IU]/mL (ref 0.35–4.50)

## 2018-02-09 LAB — LIPID PANEL
CHOL/HDL RATIO: 3
Cholesterol: 196 mg/dL (ref 0–200)
HDL: 61.3 mg/dL (ref >39.00)
LDL Cholesterol: 121 mg/dL — ABNORMAL HIGH (ref 0–99)
NONHDL: 134.56
Triglycerides: 70 mg/dL (ref 0.0–149.0)
VLDL: 14 mg/dL (ref 0.0–40.0)

## 2018-02-09 LAB — VITAMIN D 25 HYDROXY (VIT D DEFICIENCY, FRACTURES): VITD: 68.94 ng/mL (ref 30.00–100.00)

## 2018-02-09 MED ORDER — ALENDRONATE SODIUM 70 MG PO TABS: ORAL_TABLET | ORAL | 3 refills | Status: DC

## 2018-02-09 MED ORDER — FLUTICASONE PROPIONATE 50 MCG/ACT NA SUSP: 2.0000 | Freq: Every day | NASAL | 11 refills | Status: DC

## 2018-02-09 MED ORDER — ALENDRONATE SODIUM 70 MG PO TABS: 70.0000 mg | ORAL_TABLET | ORAL | 3 refills | Status: DC

## 2018-02-09 NOTE — Assessment & Plan Note (Signed)
Due for lipid panel-ordered Very good diet Disc goals for lipids and reasons to control them Rev last labs with pt Rev low sat fat diet in detail

## 2018-02-09 NOTE — Assessment & Plan Note (Signed)
Ref for 2 y dexa No falls or fx  On  Year 4 of alendronate and tolerates well  On ca and D D level today Good exercise-enc to continue

## 2018-02-09 NOTE — Telephone Encounter (Signed)
sent 

## 2018-02-09 NOTE — Telephone Encounter (Signed)
Pharmacy left vm at triage stating Rx sent over for pt to start fosamax. Pharmacist states they are needing direct dosing instructions/frequency in order to fill. pls advise

## 2018-02-09 NOTE — Assessment & Plan Note (Signed)
Reviewed health habits including diet and exercise and skin cancer prevention Reviewed appropriate screening tests for age  Also reviewed health mt list, fam hx and immunization status , as well as social and family history   See HPI Labs ordered  Good health habits Ref for 2 y dexa  Ref to eye doctor for cataracts  Given paperwork to work on advanced directive (she is unsure who she will designate poa)

## 2018-02-09 NOTE — Assessment & Plan Note (Signed)
Pt req ref to eye surgeon in [place]

## 2018-02-09 NOTE — Progress Notes (Signed)
Subjective:    Patient ID: Kelsey Haley, female    DOB: 07-21-1945, 72 y.o.   MRN: [id number]  HPI  Here for annual medicare wellness as well as review of chronic medical problems  I have personally reviewed the Medicare Annual Wellness questionnaire and have noted 1. The patient's medical and social history 2. Their use of alcohol, tobacco or illicit drugs 3. Their current medications and supplements 4. The patient's functional ability including ADL's, fall risks, home safety risks and hearing or visual             impairment. 5. Diet and physical activities 6. Evidence for depression or mood disorders  The patients weight, height, BMI have been recorded in the chart and visual acuity is per eye clinic.  I have made referrals, counseling and provided education to the patient based review of the above and I have provided the pt with a written personalized care plan for preventive services. Reviewed and updated provider list, see scanned forms.  See scanned forms.  Routine anticipatory guidance given to patient.  See health maintenance. Colon cancer screening colonoscopy 6/17  Breast cancer screening  Mammogram 10/19 Self breast exam -no lumps  Flu vaccine-had that in sept  Tetanus vaccine 3/13 Pneumovax-completed both Zoster vaccine-zostavax 10/14 dexa 4/17 -osteopenia slt improved Fosamax - year 4 D level due -on ca and D Falls-none  Fractures-none    Advance directive-- does not have an advance directive or POA (not sure who it would be yet)  Cognitive function addressed- see scanned forms- and if abnormal then additional documentation follows.  occ she has a name on the tip of her tongue (gets it a minute later)-esp when switching from english to [place]  Very very busy  No confusion  Not getting lost   PMH and SH reviewed  Meds, vitals, and allergies reviewed.   ROS: See HPI.  Otherwise negative.    Hearing Screening Comments: Hearing test not done due to pt  wearing hearing aids Vision Screening Comments: Pt had eye exam with Dr. Gloriann Loan in April 2019  Wt Readings from Last 3 Encounters:  02/09/18 146 lb 8 oz (66.5 kg)  04/29/17 154 lb 12.8 oz (70.2 kg)  04/20/17 150 lb 12 oz (68.4 kg)  wt is down- she is trying to loose  Eating healthy and exercises (machines)  26.16 kg/m   Has cataracts and wants ref to eye surgeon in [place]   Has been traveling ([place]/ and [place]) Getting over a virus (both GI and also uri)  Still wiped out  Drinking fluids-tea with honey  Tired today and generally weak   Hyperlipidemia Lab Results  Component Value Date   CHOL 226 (H) 10/23/2016   HDL 74.80 10/23/2016   LDLCALC 137 (H) 10/23/2016   LDLDIRECT 143.0 08/10/2012   TRIG 69.0 10/23/2016   CHOLHDL 3 10/23/2016   Eating better  Did have a light breakfast       Review of Systems     Objective:   Physical Exam  Constitutional: She appears well-developed and well-nourished. No distress.  HENT:  Head: Normocephalic and atraumatic.  Mouth/Throat: Oropharynx is clear and moist.  Hearing aides present   Eyes: Pupils are equal, round, and reactive to light. Conjunctivae and EOM are normal. No scleral icterus.  Neck: Normal range of motion. Neck supple. No JVD present. Carotid bruit is not present. No thyromegaly present.  Cardiovascular: Normal rate, regular rhythm, normal heart sounds and intact distal pulses. Exam reveals no  gallop.  Pulmonary/Chest: Effort normal and breath sounds normal. No respiratory distress. She has no wheezes. She exhibits no tenderness. No breast tenderness, discharge or bleeding.  Abdominal: Soft. Bowel sounds are normal. She exhibits no distension, no abdominal bruit and no mass. There is no tenderness.  Genitourinary:  Genitourinary Comments: Breast exam: No mass, nodules, thickening, tenderness, bulging, retraction, inflamation, nipple discharge or skin changes noted.  No axillary or clavicular LA.        Musculoskeletal: Normal range of motion. She exhibits no edema or tenderness.  Lymphadenopathy:    She has no cervical adenopathy.  Neurological: She is alert. She has normal reflexes. No cranial nerve deficit. She exhibits normal muscle tone. Coordination normal.  Skin: Skin is warm and dry. No rash noted. No erythema. No pallor.  Baseline lipoma on L mid back  lower extremities   Psychiatric: She has a normal mood and affect.          Assessment & Plan:   Problem List Items Addressed This Visit      Musculoskeletal and Integument   Osteopenia    Ref for 2 y dexa No falls or fx  On  Year 4 of alendronate and tolerates well  On ca and D D level today Good exercise-enc to continue        Other   Vitamin D deficiency    D level with labs today Disc imp to bone and overall health       Relevant Orders   VITAMIN D 25 Hydroxy (Vit-D Deficiency, Fractures)   Routine general medical examination at a health care facility    Reviewed health habits including diet and exercise and skin cancer prevention Reviewed appropriate screening tests for age  Also reviewed health mt list, fam hx and immunization status , as well as social and family history   See HPI Labs ordered  Good health habits Ref for 2 y dexa  Ref to eye doctor for cataracts  Given paperwork to work on advanced directive (she is unsure who she will designate poa)       Relevant Orders   CBC with Differential/Platelet   Comprehensive metabolic panel   TSH   HYPERCHOLESTEROLEMIA    Due for lipid panel-ordered Very good diet Disc goals for lipids and reasons to control them Rev last labs with pt Rev low sat fat diet in detail       Relevant Orders   Comprehensive metabolic panel   Lipid panel   Estrogen deficiency   Relevant Orders   DG Bone Density   Encounter for Medicare annual wellness exam - Primary    Reviewed health habits including diet and exercise and skin cancer prevention Reviewed  appropriate screening tests for age  Also reviewed health mt list, fam hx and immunization status , as well as social and family history   See HPI Labs ordered  Good health habits Ref for 2 y dexa  Ref to eye doctor for cataracts  Given paperwork to work on advanced directive (she is unsure who she will designate poa)        Cataracts, bilateral    Pt req ref to eye surgeon in [place]      Relevant Orders   Ambulatory referral to Ophthalmology

## 2018-02-09 NOTE — Assessment & Plan Note (Signed)
D level with labs today Disc imp to bone and overall health

## 2018-02-09 NOTE — Patient Instructions (Addendum)
Please work on advance directive - blue packet (living will and power of attorney)   Keep working your brain - read/listen/socialize and also puzzles and math help   Stop up front for referral for bone density test and also for eye surgeon   Labs today

## 2018-03-19 ENCOUNTER — Encounter: Payer: Self-pay | Admitting: Family Medicine

## 2018-03-19 DIAGNOSIS — H2513 Age-related nuclear cataract, bilateral: Secondary | ICD-10-CM | POA: Diagnosis not present

## 2018-03-23 ENCOUNTER — Ambulatory Visit
Admission: RE | Admit: 2018-03-23 | Discharge: 2018-03-23 | Disposition: A | Discharge status: Home / Self Care | Payer: PPO | Source: Ambulatory Visit | Attending: Family Medicine | Admitting: Family Medicine

## 2018-03-23 DIAGNOSIS — M8588 Other specified disorders of bone density and structure, other site: Secondary | ICD-10-CM | POA: Diagnosis not present

## 2018-03-23 DIAGNOSIS — E2839 Other primary ovarian failure: Secondary | ICD-10-CM

## 2018-07-29 ENCOUNTER — Other Ambulatory Visit: Payer: Self-pay | Admitting: Family Medicine

## 2018-12-27 ENCOUNTER — Other Ambulatory Visit: Payer: Self-pay | Admitting: Family Medicine

## 2018-12-27 NOTE — Telephone Encounter (Signed)
Last OV was a AWV on 02/09/18, no future appts., last filled on 11/20/17 #30 tabs with 1 refill, please advise

## 2019-02-22 ENCOUNTER — Other Ambulatory Visit: Payer: Self-pay | Admitting: Family Medicine

## 2019-03-29 ENCOUNTER — Other Ambulatory Visit: Payer: Self-pay | Admitting: Family Medicine

## 2019-03-29 NOTE — Telephone Encounter (Signed)
Please schedule a spring f/u and refill until then 

## 2019-03-29 NOTE — Telephone Encounter (Signed)
Pt hasn't been seen in over a year and no future appts., please advise  

## 2019-03-30 ENCOUNTER — Other Ambulatory Visit: Payer: Self-pay | Admitting: Family Medicine

## 2019-03-30 NOTE — Telephone Encounter (Signed)
Med refilled once and [facility] will try and reach out to pt to get appt scheduled

## 2019-05-13 ENCOUNTER — Ambulatory Visit: Payer: PPO | Attending: Internal Medicine

## 2019-05-13 DIAGNOSIS — Z23 Encounter for immunization: Secondary | ICD-10-CM

## 2019-05-13 NOTE — Progress Notes (Signed)
   Covid-19 Vaccination Clinic  Name:  Kelsey Haley    MRN: [id number] DOB: 06/22/1945  05/13/2019  [name] was observed post Covid-19 immunization for 15 minutes without incidence. She was provided with Vaccine Information Sheet and instruction to access the V-Safe system.   [name] was instructed to call 911 with any severe reactions post vaccine: Marland Kitchen Difficulty breathing  . Swelling of your face and throat  . A fast heartbeat  . A bad rash all over your body  . Dizziness and weakness    Immunizations Administered    Name Date Dose VIS Date Route   Pfizer COVID-19 Vaccine 05/13/2019 12:33 PM 0.3 mL 02/25/2019 Intramuscular   Manufacturer: [place]   Lot: [id number]   [place]: [id number]

## 2019-06-08 ENCOUNTER — Ambulatory Visit: Payer: PPO | Attending: Internal Medicine

## 2019-06-08 DIAGNOSIS — Z23 Encounter for immunization: Secondary | ICD-10-CM

## 2019-06-08 NOTE — Progress Notes (Signed)
   Covid-19 Vaccination Clinic  Name:  RHEMI NEIHART    MRN: [id number] DOB: [birth year]  06/08/2019  Ms. Ruelas was observed post Covid-19 immunization for 15 minutes without incident. She was provided with Vaccine Information Sheet and instruction to access the V-Safe system.   Ms. Kitchell was instructed to call 911 with any severe reactions post vaccine: Marland Kitchen Difficulty breathing  . Swelling of face and throat  . A fast heartbeat  . A bad rash all over body  . Dizziness and weakness   Immunizations Administered    Name Date Dose VIS Date Route   Pfizer COVID-19 Vaccine 06/08/2019  4:43 PM 0.3 mL 02/25/2019 Intramuscular   Manufacturer: [place]   Lot: [id number]   [place]: [id number]

## 2019-07-25 ENCOUNTER — Other Ambulatory Visit: Payer: Self-pay | Admitting: Family Medicine

## 2019-07-29 ENCOUNTER — Encounter: Payer: Self-pay | Admitting: Family Medicine

## 2019-07-29 ENCOUNTER — Other Ambulatory Visit: Payer: Self-pay

## 2019-07-29 ENCOUNTER — Other Ambulatory Visit: Payer: Self-pay | Admitting: Family Medicine

## 2019-07-29 ENCOUNTER — Ambulatory Visit (INDEPENDENT_AMBULATORY_CARE_PROVIDER_SITE_OTHER): Payer: PPO | Admitting: Family Medicine

## 2019-07-29 VITALS — BP 126/78 | HR 66 | Temp 97.4°F | Ht 62.0 in | Wt 149.5 lb

## 2019-07-29 DIAGNOSIS — D126 Benign neoplasm of colon, unspecified: Secondary | ICD-10-CM | POA: Diagnosis not present

## 2019-07-29 DIAGNOSIS — H9193 Unspecified hearing loss, bilateral: Secondary | ICD-10-CM

## 2019-07-29 DIAGNOSIS — E559 Vitamin D deficiency, unspecified: Secondary | ICD-10-CM

## 2019-07-29 DIAGNOSIS — Z0000 Encounter for general adult medical examination without abnormal findings: Secondary | ICD-10-CM | POA: Diagnosis not present

## 2019-07-29 DIAGNOSIS — M8580 Other specified disorders of bone density and structure, unspecified site: Secondary | ICD-10-CM

## 2019-07-29 DIAGNOSIS — E7800 Pure hypercholesterolemia, unspecified: Secondary | ICD-10-CM | POA: Diagnosis not present

## 2019-07-29 DIAGNOSIS — Z1231 Encounter for screening mammogram for malignant neoplasm of breast: Secondary | ICD-10-CM

## 2019-07-29 LAB — CBC WITH DIFFERENTIAL/PLATELET
Basophils Absolute: 0 10*3/uL (ref 0.0–0.1)
Basophils Relative: 0.8 % (ref 0.0–3.0)
Eosinophils Absolute: 0.2 10*3/uL (ref 0.0–0.7)
Eosinophils Relative: 3.2 % (ref 0.0–5.0)
HCT: 42.2 % (ref 36.0–46.0)
Hemoglobin: 14.2 g/dL (ref 12.0–15.0)
Lymphocytes Relative: 41.9 % (ref 12.0–46.0)
Lymphs Abs: 2.1 10*3/uL (ref 0.7–4.0)
MCHC: 33.7 g/dL (ref 30.0–36.0)
MCV: 95 fl (ref 78.0–100.0)
Monocytes Absolute: 0.5 10*3/uL (ref 0.1–1.0)
Monocytes Relative: 10 % (ref 3.0–12.0)
Neutro Abs: 2.3 10*3/uL (ref 1.4–7.7)
Neutrophils Relative %: 44.1 % (ref 43.0–77.0)
Platelets: 270 10*3/uL (ref 150.0–400.0)
RBC: 4.44 Mil/uL (ref 3.87–5.11)
RDW: 14 % (ref 11.5–15.5)
WBC: 5.1 10*3/uL (ref 4.0–10.5)

## 2019-07-29 LAB — LIPID PANEL
Cholesterol: 234 mg/dL — ABNORMAL HIGH (ref 0–200)
HDL: 74.8 mg/dL (ref >39.00)
LDL Cholesterol: 146 mg/dL — ABNORMAL HIGH (ref 0–99)
NonHDL: 159.16
Total CHOL/HDL Ratio: 3
Triglycerides: 64 mg/dL (ref 0.0–149.0)
VLDL: 12.8 mg/dL (ref 0.0–40.0)

## 2019-07-29 LAB — COMPREHENSIVE METABOLIC PANEL
ALT: 21 U/L (ref 0–35)
AST: 28 U/L (ref 0–37)
Albumin: 4.5 g/dL (ref 3.5–5.2)
Alkaline Phosphatase: 53 U/L (ref 39–117)
BUN: 21 mg/dL (ref 6–23)
CO2: 29 mEq/L (ref 19–32)
Calcium: 9.6 mg/dL (ref 8.4–10.5)
Chloride: 103 mEq/L (ref 96–112)
Creatinine, Ser: 0.73 mg/dL (ref 0.40–1.20)
GFR: 78.06 mL/min (ref >60.00)
Glucose, Bld: 98 mg/dL (ref 70–99)
Potassium: 3.9 mEq/L (ref 3.5–5.1)
Sodium: 138 mEq/L (ref 135–145)
Total Bilirubin: 0.6 mg/dL (ref 0.2–1.2)
Total Protein: 7.4 g/dL (ref 6.0–8.3)

## 2019-07-29 LAB — VITAMIN D 25 HYDROXY (VIT D DEFICIENCY, FRACTURES): VITD: 80.08 ng/mL (ref 30.00–100.00)

## 2019-07-29 LAB — TSH: TSH: 2.24 u[IU]/mL (ref 0.35–4.50)

## 2019-07-29 MED ORDER — FLUTICASONE PROPIONATE 50 MCG/ACT NA SUSP: NASAL | 5 refills | Status: DC

## 2019-07-29 NOTE — Patient Instructions (Addendum)
Go ahead and make your mammogram appointment   Your colonoscopy is due in another year   Please work on your advance directive and then get it notarized   Labs are stable  Keep taking care of yourself Stay active

## 2019-07-29 NOTE — Progress Notes (Signed)
Subjective:    Patient ID: FAE STETZ, female    DOB: 08-30-1945, 74 y.o.   MRN: [id number]  This visit occurred during the SARS-CoV-2 public health emergency.  Safety protocols were in place, including screening questions prior to the visit, additional usage of staff PPE, and extensive cleaning of exam room while observing appropriate contact time as indicated for disinfecting solutions.    HPI Here for amw and health mt exam with review of chronic medical problems   I have personally reviewed the Medicare Annual Wellness questionnaire and have noted 1. The patient's medical and social history 2. Their use of alcohol, tobacco or illicit drugs 3. Their current medications and supplements 4. The patient's functional ability including ADL's, fall risks, home safety risks and hearing or visual             impairment. 5. Diet and physical activities 6. Evidence for depression or mood disorders  The patients weight, height, BMI have been recorded in the chart and visual acuity is per eye clinic.  I have made referrals, counseling and provided education to the patient based review of the above and I have provided the pt with a written personalized care plan for preventive services. Reviewed and updated provider list, see scanned forms.  See scanned forms.  Routine anticipatory guidance given to patient.  See health maintenance. Colon cancer screening colonoscopy 6/17  (had adenomas)-- 5 yr recall  Breast cancer screening  Mammogram 10/19 -she cannot schedule it until after this visit  Self breast exam-no lumps or change  Flu vaccine 9/19 covid status -had pfizer vaccine series  (made her a bit dizzy but overall did well)  Tetanus vaccine 3/13 Tdap Pneumovax 8/15 -complete Zoster vaccine-zostavax 10/14  Dexa 1/20 -stable osteopenia  Year 5 of alendronate -stopped after that  Falls-none Fractures-none  Supplements-takes ca and D  D level tx at 59  Exercise - daily   [employer]  directive- needs to work on /given materials  Cognitive function addressed- see scanned forms- and if abnormal then additional documentation follows.  Memory has been very good for her age  occ has to search for a word for a few extra seconds  Has not been confused or lost  Handles her finances   PMH and SH reviewed  Meds, vitals, and allergies reviewed.   ROS: See HPI.  Otherwise negative.    Depression screen -score of 0  Weight : Wt Readings from Last 3 Encounters:  07/29/19 149 lb 8 oz (67.8 kg)  02/09/18 146 lb 8 oz (66.5 kg)  04/29/17 154 lb 12.8 oz (70.2 kg)  stable  Eats very healthy 27.34 kg/m   Hearing/vision: bilat hearing aides - work fairly well   Hearing Screening   125Hz  250Hz  500Hz  1000Hz  2000Hz  3000Hz  4000Hz  6000Hz  8000Hz   Right ear:           Left ear:             Visual Acuity Screening   Right eye Left eye Both eyes  Without correction:     With correction: 20/20 20/25 20/20    Doing well overall   Staying busy  A little more time to get going in the am  Continues to exercise   BP Readings from Last 3 Encounters:  07/29/19 126/78  02/09/18 124/80  04/29/17 123/73  bp is lower at home -- usually   Pulse Readings from Last 3 Encounters:  07/29/19 66  02/09/18 61  04/29/17 68  Hyperlipidemia  Lab Results  Component Value Date   CHOL 196 02/09/2018   CHOL 226 (H) 10/23/2016   CHOL 221 (H) 05/16/2015   Lab Results  Component Value Date   HDL 61.30 02/09/2018   HDL 74.80 10/23/2016   HDL 76.60 05/16/2015   Lab Results  Component Value Date   LDLCALC 121 (H) 02/09/2018   LDLCALC 137 (H) 10/23/2016   LDLCALC 132 (H) 05/16/2015   Lab Results  Component Value Date   TRIG 70.0 02/09/2018   TRIG 69.0 10/23/2016   TRIG 61.0 05/16/2015   Lab Results  Component Value Date   CHOLHDL 3 02/09/2018   CHOLHDL 3 10/23/2016   CHOLHDL 3 05/16/2015   Lab Results  Component Value Date   LDLDIRECT 143.0 08/10/2012   LDLDIRECT 142.9  06/02/2011   LDLDIRECT 133.3 01/08/2010    Overall the ratio is stable  She watches her diet (does not eat fat as she does not tolerate it)   Other labs: Results for orders placed or performed in visit on 02/09/18  CBC with Differential/Platelet  Result Value Ref Range   WBC 3.8 (L) 4.0 - 10.5 K/uL   RBC 4.60 3.87 - 5.11 Mil/uL   Hemoglobin 14.6 12.0 - 15.0 g/dL   HCT 43.7 36.0 - 46.0 %   MCV 94.8 78.0 - 100.0 fl   MCHC 33.5 30.0 - 36.0 g/dL   RDW 13.6 11.5 - 15.5 %   Platelets 255.0 150.0 - 400.0 K/uL   Neutrophils Relative % 39.7 (L) 43.0 - 77.0 %   Lymphocytes Relative 37.2 12.0 - 46.0 %   Monocytes Relative 22.1 Repeated and verified X2. (H) 3.0 - 12.0 %   Eosinophils Relative 0.3 0.0 - 5.0 %   Basophils Relative 0.7 0.0 - 3.0 %   Neutro Abs 1.5 1.4 - 7.7 K/uL   Lymphs Abs 1.4 0.7 - 4.0 K/uL   Monocytes Absolute 0.8 0.1 - 1.0 K/uL   Eosinophils Absolute 0.0 0.0 - 0.7 K/uL   Basophils Absolute 0.0 0.0 - 0.1 K/uL  Comprehensive metabolic panel  Result Value Ref Range   Sodium 138 135 - 145 mEq/L   Potassium 4.2 3.5 - 5.1 mEq/L   Chloride 102 96 - 112 mEq/L   CO2 27 19 - 32 mEq/L   Glucose, Bld 105 (H) 70 - 99 mg/dL   BUN 20 6 - 23 mg/dL   Creatinine, Ser 0.82 0.40 - 1.20 mg/dL   Total Bilirubin 0.6 0.2 - 1.2 mg/dL   Alkaline Phosphatase 47 39 - 117 U/L   AST 22 0 - 37 U/L   ALT 16 0 - 35 U/L   Total Protein 7.7 6.0 - 8.3 g/dL   Albumin 4.6 3.5 - 5.2 g/dL   Calcium 9.6 8.4 - 10.5 mg/dL   GFR 72.85 >60.00 mL/min  Lipid panel  Result Value Ref Range   Cholesterol 196 0 - 200 mg/dL   Triglycerides 70.0 0.0 - 149.0 mg/dL   HDL 61.30 >39.00 mg/dL   VLDL 14.0 0.0 - 40.0 mg/dL   LDL Cholesterol 121 (H) 0 - 99 mg/dL   Total CHOL/HDL Ratio 3    NonHDL 134.56   TSH  Result Value Ref Range   TSH 1.20 0.35 - 4.50 uIU/mL  VITAMIN D 25 Hydroxy (Vit-D Deficiency, Fractures)  Result Value Ref Range   VITD 68.94 30.00 - 100.00 ng/mL    Glucose was non fasting   Reviewed  health habits including diet and exercise and skin  cancer prevention Reviewed appropriate screening tests for age  Also reviewed health mt list, fam hx and immunization status , as well as social and family history    Review of Systems  Constitutional: Negative for activity change, appetite change, fatigue, fever and unexpected weight change.  HENT: Negative for congestion, ear pain, rhinorrhea, sinus pressure and sore throat.   Eyes: Negative for pain, redness and visual disturbance.  Respiratory: Negative for cough, shortness of breath and wheezing.   Cardiovascular: Negative for chest pain and palpitations.  Gastrointestinal: Negative for abdominal pain, blood in stool, constipation and diarrhea.  Endocrine: Negative for polydipsia and polyuria.  Genitourinary: Negative for dysuria, frequency and urgency.  Musculoskeletal: Negative for arthralgias, back pain and myalgias.  Skin: Negative for pallor and rash.  Allergic/Immunologic: Negative for environmental allergies.  Neurological: Negative for dizziness, syncope and headaches.  Hematological: Negative for adenopathy. Does not bruise/bleed easily.  Psychiatric/Behavioral: Negative for decreased concentration and dysphoric mood. The patient is not nervous/anxious.        Objective:   Physical Exam Constitutional:      General: She is not in acute distress.    Appearance: Normal appearance. She is well-developed and normal weight. She is not ill-appearing or diaphoretic.  HENT:     Head: Normocephalic and atraumatic.     Right Ear: Tympanic membrane, ear canal and external ear normal.     Left Ear: Tympanic membrane, ear canal and external ear normal.     Nose: Nose normal. No congestion.     Mouth/Throat:     Mouth: Mucous membranes are moist.     Pharynx: Oropharynx is clear. No posterior oropharyngeal erythema.  Eyes:     General: No scleral icterus.    Extraocular Movements: Extraocular movements intact.      Conjunctiva/sclera: Conjunctivae normal.     Pupils: Pupils are equal, round, and reactive to light.  Neck:     Thyroid: No thyromegaly.     Vascular: No carotid bruit or JVD.  Cardiovascular:     Rate and Rhythm: Normal rate and regular rhythm.     Pulses: Normal pulses.     Heart sounds: Normal heart sounds. No gallop.   Pulmonary:     Effort: Pulmonary effort is normal. No respiratory distress.     Breath sounds: Normal breath sounds. No wheezing.     Comments: Good air exch Chest:     Chest wall: No tenderness.  Abdominal:     General: Bowel sounds are normal. There is no distension or abdominal bruit.     Palpations: Abdomen is soft. There is no mass.     Tenderness: There is no abdominal tenderness.     Hernia: No hernia is present.  Genitourinary:    Comments: Breast exam: No mass, nodules, thickening, tenderness, bulging, retraction, inflamation, nipple discharge or skin changes noted.  No axillary or clavicular LA.     Musculoskeletal:        General: No tenderness. Normal range of motion.     Cervical back: Normal range of motion and neck supple. No rigidity. No muscular tenderness.     Right lower leg: No edema.     Left lower leg: No edema.     Comments: No kyphosis   Lymphadenopathy:     Cervical: No cervical adenopathy.  Skin:    General: Skin is warm and dry.     Coloration: Skin is not pale.     Findings: No erythema or rash.  Comments: Olive complexion Solar lentigines diffusely   Neurological:     Mental Status: She is alert. Mental status is at baseline.     Cranial Nerves: No cranial nerve deficit.     Motor: No abnormal muscle tone.     Coordination: Coordination normal.     Gait: Gait normal.     Deep Tendon Reflexes: Reflexes are normal and symmetric. Reflexes normal.  Psychiatric:        Mood and Affect: Mood normal.        Cognition and Memory: Cognition and memory normal.           Assessment & Plan:   Problem List Items Addressed  This Visit      Digestive   COLONIC POLYPS     Nervous and Auditory   Hearing loss    Continues to use hearing aides        Musculoskeletal and Integument   Osteopenia    dexa 1/20 stable  Finished alendronate this year No falls or fx  D level tx Exercise is regular         Other   Vitamin D deficiency    Vitamin D level is therapeutic with current supplementation Disc importance of this to bone and overall health       Relevant Orders   VITAMIN D 25 Hydroxy (Vit-D Deficiency, Fractures) (Completed)   HYPERCHOLESTEROLEMIA    Stable ratio  Disc goals for lipids and reasons to control them Rev last labs with pt Rev low sat fat diet in detail  LDL is down      Relevant Orders   Comprehensive metabolic panel (Completed)   Lipid panel (Completed)   Routine general medical examination at a health care facility    Reviewed health habits including diet and exercise and skin cancer prevention Reviewed appropriate screening tests for age  Also reviewed health mt list, fam hx and immunization status , as well as social and family history   Pt given number to schedule mammogram  utd vaccines  dexa utd/finished alendronate and no falls/fx  Given materials to work on Insurance underwriter No cognitive concerns  Uses hearing aides  Nl vision screen   Labs reviewed       Relevant Orders   Comprehensive metabolic panel (Completed)   CBC with Differential/Platelet (Completed)   TSH (Completed)   Encounter for Medicare annual wellness exam - Primary    Reviewed health habits including diet and exercise and skin cancer prevention Reviewed appropriate screening tests for age  Also reviewed health mt list, fam hx and immunization status , as well as social and family history   Pt given number to schedule mammogram  utd vaccines  dexa utd/finished alendronate and no falls/fx  Given materials to work on Insurance underwriter No cognitive concerns  Uses hearing aides  Nl vision screen     Labs reviewed

## 2019-07-30 NOTE — Assessment & Plan Note (Signed)
Continues to use hearing aides  

## 2019-07-30 NOTE — Assessment & Plan Note (Signed)
dexa 1/20 stable  Finished alendronate this year No falls or fx  D level tx Exercise is regular

## 2019-07-30 NOTE — Assessment & Plan Note (Signed)
Reviewed health habits including diet and exercise and skin cancer prevention Reviewed appropriate screening tests for age  Also reviewed health mt list, fam hx and immunization status , as well as social and family history   Pt given number to schedule mammogram  utd vaccines  dexa utd/finished alendronate and no falls/fx  Given materials to work on [employer] directive No cognitive concerns  Uses hearing aides  Nl vision screen   Labs reviewed

## 2019-07-30 NOTE — Assessment & Plan Note (Signed)
Stable ratio  Disc goals for lipids and reasons to control them Rev last labs with pt Rev low sat fat diet in detail  LDL is down

## 2019-07-30 NOTE — Assessment & Plan Note (Signed)
Vitamin D level is therapeutic with current supplementation Disc importance of this to bone and overall health  

## 2019-08-02 ENCOUNTER — Ambulatory Visit
Admission: RE | Admit: 2019-08-02 | Discharge: 2019-08-02 | Disposition: A | Discharge status: Home / Self Care | Payer: PPO | Source: Ambulatory Visit | Attending: Family Medicine | Admitting: Family Medicine

## 2019-08-02 DIAGNOSIS — Z1231 Encounter for screening mammogram for malignant neoplasm of breast: Secondary | ICD-10-CM | POA: Diagnosis not present

## 2019-10-04 ENCOUNTER — Other Ambulatory Visit: Payer: Self-pay

## 2019-10-04 ENCOUNTER — Ambulatory Visit (INDEPENDENT_AMBULATORY_CARE_PROVIDER_SITE_OTHER): Payer: PPO | Admitting: Family Medicine

## 2019-10-04 ENCOUNTER — Encounter: Payer: Self-pay | Admitting: Family Medicine

## 2019-10-04 VITALS — BP 134/82 | HR 61 | Temp 96.8°F | Ht 62.0 in | Wt 144.1 lb

## 2019-10-04 DIAGNOSIS — B029 Zoster without complications: Secondary | ICD-10-CM

## 2019-10-04 DIAGNOSIS — E7800 Pure hypercholesterolemia, unspecified: Secondary | ICD-10-CM | POA: Diagnosis not present

## 2019-10-04 HISTORY — DX: Zoster without complications: B02.9

## 2019-10-04 LAB — LIPID PANEL
Cholesterol: 202 mg/dL — ABNORMAL HIGH (ref 0–200)
HDL: 66.9 mg/dL (ref >39.00)
LDL Cholesterol: 120 mg/dL — ABNORMAL HIGH (ref 0–99)
NonHDL: 135.05
Total CHOL/HDL Ratio: 3
Triglycerides: 77 mg/dL (ref 0.0–149.0)
VLDL: 15.4 mg/dL (ref 0.0–40.0)

## 2019-10-04 MED ORDER — VALACYCLOVIR HCL 1 G PO TABS
1000.0000 mg | ORAL_TABLET | Freq: Three times a day (TID) | ORAL | 0 refills | Status: DC
Start: 2019-10-04 — End: 2021-11-27

## 2019-10-04 NOTE — Progress Notes (Signed)
Subjective:    Patient ID: Kelsey Haley, female    DOB: [birth year], 74 y.o.   MRN: [id number]  This visit occurred during the SARS-CoV-2 public health emergency.  Safety protocols were in place, including screening questions prior to the visit, additional usage of staff PPE, and extensive cleaning of exam room while observing appropriate contact time as indicated for disinfecting solutions.    HPI  Pt presents with rash on lower abdomen that itches  Also cholesterol   Started about 5 days ago  It itches and burns  On L low abdomen and under L arm   She used some cerave itch cream It is getting worse   She has soreness around areas of rash   In 2014 had zostavax  Has not had shingrix   Lab Results  Component Value Date   CHOL 234 (H) 07/29/2019   HDL 74.80 07/29/2019   LDLCALC 146 (H) 07/29/2019   LDLDIRECT 143.0 08/10/2012   TRIG 64.0 07/29/2019   CHOLHDL 3 07/29/2019   Pt has watched diet very closely  Wants to re check today  Patient Active Problem List   Diagnosis Date Noted  . Zoster 10/04/2019  . Family history of uterine cancer 04/20/2017  . Screening mammogram, encounter for 11/12/2016  . Encounter for Medicare annual wellness exam 05/16/2015  . Stress headaches 05/16/2015  . Estrogen deficiency 05/16/2015  . Lipoma 12/20/2014  . Encounter for routine gynecological examination 08/16/2012  . Routine general medical examination at a health care facility 08/10/2012  . Vitamin D deficiency 01/08/2010  . HYPERCHOLESTEROLEMIA 01/08/2010  . COLONIC POLYPS 10/01/2009  . Hearing loss 11/22/2008  . INTERMITTENT VERTIGO 11/22/2008  . Osteopenia 01/27/2007   Past Medical History:  Diagnosis Date  . Encounter for Medicare annual wellness exam 05/16/2015  . Estrogen deficiency 05/16/2015  . Family history of uterine cancer 04/20/2017  . Headache    occasional migranes  . Hearing loss   . Hearing loss 11/22/2008   Qualifier: Diagnosis of  By: Glori Bickers MD, Carmell Austria   .  HYPERCHOLESTEROLEMIA 01/08/2010   Qualifier: Diagnosis of  By: Joesph Fillers CMA (AAMA), Ronny Bacon    . INTERMITTENT VERTIGO 11/22/2008   Qualifier: Diagnosis of  By: Glori Bickers MD, Carmell Austria   . Lipoma 12/20/2014   On L back   . Osteopenia   . Rash and nonspecific skin eruption 05/16/2015   Atopic? -occ neck or L hand  Uses triamcinolone   . Screening mammogram, encounter for 11/12/2016  . Stress headaches 05/16/2015  . Vertigo   . Vitamin D deficiency 01/08/2010   Qualifier: Diagnosis of  By: Joesph Fillers CMA (AAMA), Ronny Bacon     Past Surgical History:  Procedure Laterality Date  . COLONOSCOPY WITH PROPOFOL N/A 08/20/2015   Procedure: COLONOSCOPY WITH PROPOFOL;  Surgeon: Manya Silvas, MD;  Location: [facility] ENDOSCOPY;  Service: Endoscopy;  Laterality: N/A;  . left knee surgery    . TONSILLECTOMY     Social History   Tobacco Use  . Smoking status: Former Smoker    Quit date: 03/17/1994    Years since quitting: 25.5  . Smokeless tobacco: Never Used  . Tobacco comment: was a light smoker before quitting  Substance Use Topics  . Alcohol use: Yes    Alcohol/week: 0.0 standard drinks    Comment: occ-wine  . Drug use: No   Family History  Problem Relation Age of Onset  . Uterine cancer Sister   . Prostate cancer Father   . Prostate cancer  Brother   . Breast cancer Neg Hx    No Known Allergies Current Outpatient Medications on File Prior to Visit  Medication Sig Dispense Refill  . Ascorbic Acid (VITAMIN C) 1000 MG tablet Take 1,000 mg by mouth daily.    Marland Kitchen b complex vitamins capsule Take 1 capsule by mouth daily.    . Calcium-Magnesium-Zinc 500-250-12.5 MG TABS Take 2 tablets by mouth daily.    . Cholecalciferol (VITAMIN D-3) 5000 units TABS Take 1 tablet by mouth daily.    . fluticasone (FLONASE) 50 MCG/ACT nasal spray SHAKE LIQUID AND USE 2 SPRAYS IN EACH NOSTRIL DAILY 16 g 5  . KOREAN GINSENG PO Take 1 tablet by mouth daily.    . meclizine (ANTIVERT) 25 MG tablet TAKE 1 TO 2 TABLETS BY MOUTH EVERY  8 HOURS AS NEEDED FOR DIZZINESS OR VERTIGO 30 tablet 2  . Potassium Gluconate 550 MG TABS Take 1 tablet by mouth daily.    Marland Kitchen triamcinolone cream (KENALOG) 0.1 % Apply 1 application topically 2 (two) times daily. To affected areas for no more than 10 days 45 g 3   No current facility-administered medications on file prior to visit.    Review of Systems  Constitutional: Negative for activity change, appetite change, fatigue, fever and unexpected weight change.  HENT: Negative for congestion, ear pain, rhinorrhea, sinus pressure and sore throat.   Eyes: Negative for pain, redness and visual disturbance.  Respiratory: Negative for cough, shortness of breath and wheezing.   Cardiovascular: Negative for chest pain and palpitations.  Gastrointestinal: Negative for abdominal pain, blood in stool, constipation and diarrhea.  Endocrine: Negative for polydipsia and polyuria.  Genitourinary: Negative for dysuria, frequency and urgency.  Musculoskeletal: Negative for arthralgias, back pain and myalgias.  Skin: Positive for rash. Negative for pallor.       Itchy and painful rash  Pain in surrounding torso as well   Allergic/Immunologic: Negative for environmental allergies.  Neurological: Negative for dizziness, syncope and headaches.  Hematological: Negative for adenopathy. Does not bruise/bleed easily.  Psychiatric/Behavioral: Negative for decreased concentration and dysphoric mood. The patient is not nervous/anxious.        Objective:   Physical Exam Constitutional:      General: She is not in acute distress.    Appearance: Normal appearance. She is normal weight. She is not ill-appearing.  Eyes:     General:        Right eye: No discharge.        Left eye: No discharge.     Conjunctiva/sclera: Conjunctivae normal.     Pupils: Pupils are equal, round, and reactive to light.  Cardiovascular:     Rate and Rhythm: Normal rate and regular rhythm.     Heart sounds: Normal heart sounds.    Pulmonary:     Effort: Pulmonary effort is normal. No respiratory distress.     Breath sounds: Normal breath sounds. No wheezing.  Abdominal:     General: Abdomen is flat.  Musculoskeletal:     Cervical back: Neck supple.  Lymphadenopathy:     Cervical: No cervical adenopathy.  Skin:    General: Skin is warm and dry.     Findings: Rash present.     Comments: Clusters of small pink vesicles in L low abd/groin area and also L axillae (one small vesicle cluster there)  Oldest area in pelvic area is dry   Some excoriations  Bruising on low abd from scratching   No drainage or swelling  Neurological:     Mental Status: She is alert.     Sensory: No sensory deficit.     Coordination: Coordination normal.  Psychiatric:        Mood and Affect: Mood normal.           Assessment & Plan:   Problem List Items Addressed This Visit      Other   HYPERCHOLESTEROLEMIA    Last LDL was 146 Pt has worked very hard on diet since then  Disc goals for lipids and reasons to control them Rev last labs with pt Rev low sat fat diet in detail Will check lipid profile today      Relevant Orders   Lipid panel   Zoster - Primary    Pt has rash over L lower abd/groin and also a spot under L axillae (different thoracic dermatomes)-but she feels mild deeper pain over whole L torso  Newest spot is recent so will tx with valcyclovir  Recommend soap/water cleanse (disc red flags for infection)  Benadryl for itch  ceravae cream if helpful  Cool compress Tylenol  inst to call if she has increased pain and needs pain medicine Has had zostavax-interested in shingrix later if covered  Recommend waiting 6 mo or more  Update if not starting to improve in a week or if worsening        Relevant Medications   valACYclovir (VALTREX) 1000 MG tablet

## 2019-10-04 NOTE — Assessment & Plan Note (Signed)
Last LDL was 146 Pt has worked very hard on diet since then  Disc goals for lipids and reasons to control them Rev last labs with pt Rev low sat fat diet in detail Will check lipid profile today

## 2019-10-04 NOTE — Patient Instructions (Addendum)
I think you have shingles  Take the valcyclovir as directed  Keep rash clean with soap and water  The cream is ok if it helps   Benadryl is fine  Tylenol is ok for pain but if pain worsens please let us know   Keep [place] posted - especially if you get new spots  I think you are about done   Watch for signs of infection - redness/drainage Try not to scratch   Update if not starting to improve in a week or if worsening

## 2019-10-04 NOTE — Assessment & Plan Note (Signed)
Pt has rash over L lower abd/groin and also a spot under L axillae (different thoracic dermatomes)-but she feels mild deeper pain over whole L torso  Newest spot is recent so will tx with valcyclovir  Recommend soap/water cleanse (disc red flags for infection)  Benadryl for itch  ceravae cream if helpful  Cool compress Tylenol  inst to call if she has increased pain and needs pain medicine Has had zostavax-interested in shingrix later if covered  Recommend waiting 6 mo or more  Update if not starting to improve in a week or if worsening

## 2020-11-29 ENCOUNTER — Telehealth: Payer: Self-pay | Admitting: Family Medicine

## 2020-11-30 NOTE — Telephone Encounter (Signed)
Pt hasn't been seen in over a year, last CPE was 07/29/19 and no future appts, med last filled on 12/27/18 #30 tabs with 2 refills

## 2020-11-30 NOTE — Telephone Encounter (Signed)
Please refill once and schedule a winter f/u

## 2020-11-30 NOTE — Telephone Encounter (Signed)
Med refilled once and routed to support pool to get pt scheduled

## 2020-11-30 NOTE — Telephone Encounter (Signed)
lvmtcb

## 2020-12-03 ENCOUNTER — Encounter: Payer: Self-pay | Admitting: Family Medicine

## 2020-12-03 NOTE — Telephone Encounter (Signed)
2nd Attempted   LMTCB to schedule a follow up appt Mychart sent

## 2021-08-22 DIAGNOSIS — H2511 Age-related nuclear cataract, right eye: Secondary | ICD-10-CM | POA: Diagnosis not present

## 2021-09-07 DIAGNOSIS — S0990XA Unspecified injury of head, initial encounter: Secondary | ICD-10-CM | POA: Diagnosis not present

## 2021-09-07 DIAGNOSIS — R0902 Hypoxemia: Secondary | ICD-10-CM | POA: Diagnosis not present

## 2021-09-07 DIAGNOSIS — M542 Cervicalgia: Secondary | ICD-10-CM | POA: Diagnosis not present

## 2021-09-07 DIAGNOSIS — R55 Syncope and collapse: Secondary | ICD-10-CM | POA: Diagnosis not present

## 2021-09-07 DIAGNOSIS — R42 Dizziness and giddiness: Secondary | ICD-10-CM | POA: Diagnosis not present

## 2021-09-07 DIAGNOSIS — I959 Hypotension, unspecified: Secondary | ICD-10-CM | POA: Diagnosis not present

## 2021-09-07 DIAGNOSIS — R0602 Shortness of breath: Secondary | ICD-10-CM | POA: Diagnosis not present

## 2021-09-07 DIAGNOSIS — N390 Urinary tract infection, site not specified: Secondary | ICD-10-CM | POA: Diagnosis not present

## 2021-09-07 DIAGNOSIS — M47812 Spondylosis without myelopathy or radiculopathy, cervical region: Secondary | ICD-10-CM | POA: Diagnosis not present

## 2021-09-07 DIAGNOSIS — Z743 Need for continuous supervision: Secondary | ICD-10-CM | POA: Diagnosis not present

## 2021-09-07 DIAGNOSIS — R519 Headache, unspecified: Secondary | ICD-10-CM | POA: Diagnosis not present

## 2021-09-08 DIAGNOSIS — R55 Syncope and collapse: Secondary | ICD-10-CM | POA: Diagnosis not present

## 2021-09-10 DIAGNOSIS — Z01818 Encounter for other preprocedural examination: Secondary | ICD-10-CM | POA: Diagnosis not present

## 2021-09-10 DIAGNOSIS — H2511 Age-related nuclear cataract, right eye: Secondary | ICD-10-CM | POA: Diagnosis not present

## 2021-09-10 DIAGNOSIS — H52221 Regular astigmatism, right eye: Secondary | ICD-10-CM | POA: Diagnosis not present

## 2021-09-24 ENCOUNTER — Encounter: Payer: Self-pay | Admitting: Family Medicine

## 2021-09-24 ENCOUNTER — Ambulatory Visit: Payer: PPO | Admitting: Family Medicine

## 2021-09-24 VITALS — BP 142/90 | HR 68 | Ht 62.0 in | Wt 148.8 lb

## 2021-09-24 DIAGNOSIS — R55 Syncope and collapse: Secondary | ICD-10-CM | POA: Insufficient documentation

## 2021-09-24 DIAGNOSIS — Z01818 Encounter for other preprocedural examination: Secondary | ICD-10-CM

## 2021-09-24 NOTE — Progress Notes (Signed)
Subjective:    Patient ID: Kelsey Haley, female    DOB: 04/04/1945, 76 y.o.   MRN: [id number]  HPI Pt presents for hospital f/u after syncope Also needs clearance for upcoming cataract surgery   Wt Readings from Last 3 Encounters:  09/24/21 148 lb 12.8 oz (67.5 kg)  10/04/19 144 lb 2 oz (65.4 kg)  07/29/19 149 lb 8 oz (67.8 kg)   27.22 kg/m  Was hospitalized for syncope in [place] her bp was low   It was hot and she fainted while walking Had drank 1/2 of a beer  Got dizzy and she put her head on the table and then fell back and hit her head on the floor  Family called EMS  Per pt  Ultrasound of neck Cxr  Ekg , echo  CT of head   Did not find anything     She has fainted in the past  Many times - starting in childhood Runs in the family   Now she has more trouble  She has to have coffee with milk in am to wake up  Then feels fine   Not driving due to cataract right now   Sometimes she just feels weak for no reason     BP Readings from Last 3 Encounters:  09/24/21 (!) 140/98  10/04/19 134/82  07/29/19 126/78  Nervous Did not sleep well last night  Pulse Readings from Last 3 Encounters:  09/24/21 68  10/04/19 61  07/29/19 66    EKG today  Sinus bradycardia rate of 54 RSR V1 Diffuse ST depression (non diagnostic)   Patient Active Problem List   Diagnosis Date Noted   Syncope 09/24/2021   Pre-operative clearance 09/24/2021   Zoster 10/04/2019   Family history of uterine cancer 04/20/2017   Screening mammogram, encounter for 11/12/2016   Encounter for Medicare annual wellness exam 05/16/2015   Stress headaches 05/16/2015   Estrogen deficiency 05/16/2015   Lipoma 12/20/2014   Encounter for routine gynecological examination 08/16/2012   Routine general medical examination at a health care facility 08/10/2012   Vitamin D deficiency 01/08/2010   HYPERCHOLESTEROLEMIA 01/08/2010   COLONIC POLYPS 10/01/2009   Hearing loss 11/22/2008    INTERMITTENT VERTIGO 11/22/2008   Osteopenia 01/27/2007   Past Medical History:  Diagnosis Date   Encounter for Medicare annual wellness exam 05/16/2015   Estrogen deficiency 05/16/2015   Family history of uterine cancer 04/20/2017   Headache    occasional migranes   Hearing loss    Hearing loss 11/22/2008   Qualifier: Diagnosis of  By: Glori Bickers MD, [place] 01/08/2010   Qualifier: Diagnosis of  By: Joesph Fillers CMA (AAMA), Natasha     INTERMITTENT VERTIGO 11/22/2008   Qualifier: Diagnosis of  By: Glori Bickers MD, Carmell [place]    Lipoma 12/20/2014   On L back    Osteopenia    Rash and nonspecific skin eruption 05/16/2015   Atopic? -occ neck or L hand  Uses triamcinolone    Screening mammogram, encounter for 11/12/2016   Stress headaches 05/16/2015   Vertigo    Vitamin D deficiency 01/08/2010   Qualifier: Diagnosis of  By: Joesph Fillers CMA (AAMA), Ronny Bacon     Past Surgical History:  Procedure Laterality Date   COLONOSCOPY WITH PROPOFOL N/A 08/20/2015   Procedure: COLONOSCOPY WITH PROPOFOL;  Surgeon: Manya Silvas, MD;  Location: [facility] ENDOSCOPY;  Service: Endoscopy;  Laterality: N/A;   left knee surgery     TONSILLECTOMY  Social History   Tobacco Use   Smoking status: Former    Types: Cigarettes    Quit date: 03/17/1994    Years since quitting: 27.5   Smokeless tobacco: Never   Tobacco comments:    was a light smoker before quitting  Substance Use Topics   Alcohol use: Yes    Alcohol/week: 0.0 standard drinks of alcohol    Comment: occ-wine   Drug use: No   Family History  Problem Relation Age of Onset   Uterine cancer Sister    Prostate cancer Father    Prostate cancer Brother    Breast cancer Neg Hx    No Known Allergies Current Outpatient Medications on File Prior to Visit  Medication Sig Dispense Refill   Ascorbic Acid (VITAMIN C) 1000 MG tablet Take 1,000 mg by mouth daily.     b complex vitamins capsule Take 1 capsule by mouth daily.     Calcium-Magnesium-Zinc  500-250-12.5 MG TABS Take 2 tablets by mouth daily.     Cholecalciferol (VITAMIN D-3) 5000 units TABS Take 1 tablet by mouth daily.     fluticasone (FLONASE) 50 MCG/ACT nasal spray SHAKE LIQUID AND USE 2 SPRAYS IN EACH NOSTRIL DAILY 16 g 5   KOREAN GINSENG PO Take 1 tablet by mouth daily.     Potassium Gluconate 550 MG TABS Take 1 tablet by mouth daily.     valACYclovir (VALTREX) 1000 MG tablet Take 1 tablet (1,000 mg total) by mouth 3 (three) times daily. 21 tablet 0   meclizine (ANTIVERT) 25 MG tablet TAKE 1 TO 2 TABLETS BY MOUTH EVERY 8 HOURS AS NEEDED FOR DIZZINESS OR VERTIGO (Patient not taking: Reported on 09/24/2021) 30 tablet 0   triamcinolone cream (KENALOG) 0.1 % Apply 1 application topically 2 (two) times daily. To affected areas for no more than 10 days (Patient not taking: Reported on 09/24/2021) 45 g 3   No current facility-administered medications on file prior to visit.     Review of Systems  Constitutional:  Negative for activity change, appetite change, fatigue, fever and unexpected weight change.       Has weak spells occasionally  HENT:  Negative for congestion, ear pain, rhinorrhea, sinus pressure and sore throat.   Eyes:  Negative for pain, redness and visual disturbance.  Respiratory:  Negative for cough, shortness of breath and wheezing.   Cardiovascular:  Negative for chest pain and palpitations.  Gastrointestinal:  Negative for abdominal pain, blood in stool, constipation and diarrhea.  Endocrine: Negative for polydipsia and polyuria.  Genitourinary:  Negative for dysuria, frequency and urgency.  Musculoskeletal:  Negative for arthralgias, back pain and myalgias.  Skin:  Negative for pallor and rash.  Allergic/Immunologic: Negative for environmental allergies.  Neurological:  Positive for syncope. Negative for dizziness, tremors, facial asymmetry, speech difficulty, weakness, light-headedness, numbness and headaches.  Hematological:  Negative for adenopathy. Does not  bruise/bleed easily.  Psychiatric/Behavioral:  Negative for decreased concentration and dysphoric mood. The patient is not nervous/anxious.        Objective:   Physical Exam Constitutional:      General: She is not in acute distress.    Appearance: Normal appearance. She is well-developed and normal weight. She is not ill-appearing or diaphoretic.  HENT:     Head: Normocephalic and atraumatic.     Right Ear: Tympanic membrane and ear canal normal.     Left Ear: Tympanic membrane and ear canal normal.     Mouth/Throat:     Mouth:  Mucous membranes are moist.  Eyes:     General: No scleral icterus.    Conjunctiva/sclera: Conjunctivae normal.     Pupils: Pupils are equal, round, and reactive to light.  Neck:     Thyroid: No thyromegaly.     Vascular: No carotid bruit or JVD.  Cardiovascular:     Rate and Rhythm: Normal rate and regular rhythm.     Heart sounds: Normal heart sounds.     No gallop.  Pulmonary:     Effort: Pulmonary effort is normal. No respiratory distress.     Breath sounds: Normal breath sounds. No wheezing or rales.  Abdominal:     General: There is no distension or abdominal bruit.     Palpations: Abdomen is soft.  Musculoskeletal:     Cervical back: Normal range of motion and neck supple. No tenderness.     Right lower leg: No edema.     Left lower leg: No edema.  Lymphadenopathy:     Cervical: No cervical adenopathy.  Skin:    General: Skin is warm and dry.     Coloration: Skin is not pale.     Findings: No rash.  Neurological:     Mental Status: She is alert.     Cranial Nerves: No cranial nerve deficit.     Motor: No weakness.     Coordination: Coordination normal.     Gait: Gait normal.     Deep Tendon Reflexes: Reflexes are normal and symmetric. Reflexes normal.  Psychiatric:        Mood and Affect: Mood normal.        Cognition and Memory: Cognition and memory normal.           Assessment & Plan:   Problem List Items Addressed This  Visit       Other   Pre-operative clearance    Pt needs this for her cataract surgery Monday  However recent syncope and hosp in [facility] May need cardiology referral  Pending hosp notes for review   bp is slt elevated today, per pt lower at home and nervous       Syncope - Primary    [place]isode of [place]ope in [place] followed by 2 d hospitalization  Pt is back to baseline  ? If this was cardiogenic Per pt- had full w/u but cannot give me details Sent for d/c summary from hospital  Will most likely need cardiac referral  She needs clearance for cataract surgery on Monday- likely unable to do that until further eval  Of note-she has had syncope many times in life but now pairs it with not eating?  ? Vasovagal Pending hosp records to review  Nl exam EKG      Relevant Orders   EKG 12-Lead (Completed)

## 2021-09-24 NOTE — Assessment & Plan Note (Signed)
Pt needs this for her cataract surgery Monday  However recent syncope and hosp in [facility] May need cardiology referral  Pending hosp notes for review   bp is slt elevated today, per pt lower at home and nervous

## 2021-09-24 NOTE — Patient Instructions (Addendum)
I will send for your hospital info from [place] and review it   Most likely you will have to see a cardiologist before we can clear you   Drink fluids Watch your blood pressure  If you feel weak or faint- lie down immediately   We will contact you tomorrow after I review the information   EKG today

## 2021-09-24 NOTE — Assessment & Plan Note (Signed)
Episode of syncope in [place] followed by 2 d hospitalization  Pt is back to baseline  ? If this was cardiogenic Per pt- had full w/u but cannot give me details Sent for d/c summary from hospital  Will most likely need cardiac referral  She needs clearance for cataract surgery on Monday- likely unable to do that until further eval  Of note-she has had syncope many times in life but now pairs it with not eating?  ? Vasovagal Pending hosp records to review  Nl exam EKG

## 2021-09-25 ENCOUNTER — Telehealth: Payer: Self-pay | Admitting: Family Medicine

## 2021-09-25 DIAGNOSIS — R55 Syncope and collapse: Secondary | ICD-10-CM

## 2021-09-25 NOTE — Telephone Encounter (Signed)
The referral is in  Ask her to call in a week if she does not hear anything

## 2021-09-25 NOTE — Telephone Encounter (Signed)
I reviewed hospital notes and want her to have cardiac visit /consult before clearing her for eye surgery  Overall reassuring but want to make sure she does not have a cardiac/rhythm cause for her fainting spells  Please let he know  Will have to re schedule her eye surgery   Please let me know if she prefers [place] or [place] for cardiology and I will do the referral

## 2021-09-25 NOTE — Telephone Encounter (Signed)
Called and advised pt that we have sent referral and call us back if she hasn't heard any thing

## 2021-09-25 NOTE — Telephone Encounter (Signed)
Pt hospital notes are in her chart did want me print them out

## 2021-09-25 NOTE — Telephone Encounter (Signed)
Called and spoke with pt understood, she would like she Dr Marisue Humble in [place].

## 2021-09-25 NOTE — Addendum Note (Signed)
Addended by: Loura Pardon A on: 09/25/2021 01:31 PM   Modules accepted: Orders

## 2021-09-25 NOTE — Telephone Encounter (Signed)
Kelsey Haley from [facility] called in regarding medical clearance for surgery on Monday. Gave information that it was pending review until we got hospital records from [facility], it appears the records are scanned in under media. Kelsey Haley would like a call back in regards to this.

## 2021-10-02 ENCOUNTER — Other Ambulatory Visit (INDEPENDENT_AMBULATORY_CARE_PROVIDER_SITE_OTHER): Payer: PPO

## 2021-10-02 ENCOUNTER — Telehealth (INDEPENDENT_AMBULATORY_CARE_PROVIDER_SITE_OTHER): Payer: PPO | Admitting: Family Medicine

## 2021-10-02 DIAGNOSIS — E559 Vitamin D deficiency, unspecified: Secondary | ICD-10-CM

## 2021-10-02 DIAGNOSIS — E7800 Pure hypercholesterolemia, unspecified: Secondary | ICD-10-CM | POA: Diagnosis not present

## 2021-10-02 DIAGNOSIS — Z0000 Encounter for general adult medical examination without abnormal findings: Secondary | ICD-10-CM | POA: Diagnosis not present

## 2021-10-02 LAB — COMPREHENSIVE METABOLIC PANEL
ALT: 13 U/L (ref 0–35)
AST: 21 U/L (ref 0–37)
Albumin: 4.4 g/dL (ref 3.5–5.2)
Alkaline Phosphatase: 47 U/L (ref 39–117)
BUN: 22 mg/dL (ref 6–23)
CO2: 27 mEq/L (ref 19–32)
Calcium: 9.3 mg/dL (ref 8.4–10.5)
Chloride: 104 mEq/L (ref 96–112)
Creatinine, Ser: 0.76 mg/dL (ref 0.40–1.20)
GFR: 76.57 mL/min (ref >60.00)
Glucose, Bld: 89 mg/dL (ref 70–99)
Potassium: 4.2 mEq/L (ref 3.5–5.1)
Sodium: 139 mEq/L (ref 135–145)
Total Bilirubin: 0.7 mg/dL (ref 0.2–1.2)
Total Protein: 6.7 g/dL (ref 6.0–8.3)

## 2021-10-02 LAB — CBC WITH DIFFERENTIAL/PLATELET
Basophils Absolute: 0 10*3/uL (ref 0.0–0.1)
Basophils Relative: 0.8 % (ref 0.0–3.0)
Eosinophils Absolute: 0.3 10*3/uL (ref 0.0–0.7)
Eosinophils Relative: 5.5 % — ABNORMAL HIGH (ref 0.0–5.0)
HCT: 40.6 % (ref 36.0–46.0)
Hemoglobin: 13.5 g/dL (ref 12.0–15.0)
Lymphocytes Relative: 45.6 % (ref 12.0–46.0)
Lymphs Abs: 2.3 10*3/uL (ref 0.7–4.0)
MCHC: 33.4 g/dL (ref 30.0–36.0)
MCV: 95 fl (ref 78.0–100.0)
Monocytes Absolute: 0.5 10*3/uL (ref 0.1–1.0)
Monocytes Relative: 9.8 % (ref 3.0–12.0)
Neutro Abs: 1.9 10*3/uL (ref 1.4–7.7)
Neutrophils Relative %: 38.3 % — ABNORMAL LOW (ref 43.0–77.0)
Platelets: 256 10*3/uL (ref 150.0–400.0)
RBC: 4.27 Mil/uL (ref 3.87–5.11)
RDW: 13.6 % (ref 11.5–15.5)
WBC: 5 10*3/uL (ref 4.0–10.5)

## 2021-10-02 LAB — LIPID PANEL
Cholesterol: 218 mg/dL — ABNORMAL HIGH (ref 0–200)
HDL: 67.6 mg/dL (ref >39.00)
LDL Cholesterol: 135 mg/dL — ABNORMAL HIGH (ref 0–99)
NonHDL: 150.77
Total CHOL/HDL Ratio: 3
Triglycerides: 78 mg/dL (ref 0.0–149.0)
VLDL: 15.6 mg/dL (ref 0.0–40.0)

## 2021-10-02 LAB — VITAMIN D 25 HYDROXY (VIT D DEFICIENCY, FRACTURES): VITD: 58.73 ng/mL (ref 30.00–100.00)

## 2021-10-02 LAB — TSH: TSH: 3.13 u[IU]/mL (ref 0.35–5.50)

## 2021-10-02 NOTE — Telephone Encounter (Signed)
Lab order

## 2021-10-08 ENCOUNTER — Other Ambulatory Visit: Payer: PPO

## 2021-10-15 ENCOUNTER — Ambulatory Visit (INDEPENDENT_AMBULATORY_CARE_PROVIDER_SITE_OTHER): Payer: PPO | Admitting: Family Medicine

## 2021-10-15 ENCOUNTER — Encounter: Payer: Self-pay | Admitting: Family Medicine

## 2021-10-15 VITALS — BP 114/68 | HR 62 | Ht 61.81 in | Wt 148.8 lb

## 2021-10-15 DIAGNOSIS — D126 Benign neoplasm of colon, unspecified: Secondary | ICD-10-CM | POA: Diagnosis not present

## 2021-10-15 DIAGNOSIS — M8580 Other specified disorders of bone density and structure, unspecified site: Secondary | ICD-10-CM

## 2021-10-15 DIAGNOSIS — E559 Vitamin D deficiency, unspecified: Secondary | ICD-10-CM | POA: Diagnosis not present

## 2021-10-15 DIAGNOSIS — E2839 Other primary ovarian failure: Secondary | ICD-10-CM

## 2021-10-15 DIAGNOSIS — E7800 Pure hypercholesterolemia, unspecified: Secondary | ICD-10-CM

## 2021-10-15 DIAGNOSIS — Z0000 Encounter for general adult medical examination without abnormal findings: Secondary | ICD-10-CM

## 2021-10-15 DIAGNOSIS — Z1231 Encounter for screening mammogram for malignant neoplasm of breast: Secondary | ICD-10-CM

## 2021-10-15 NOTE — Assessment & Plan Note (Signed)
dexa ordered No falls (except for a syncopal episode)  No fractures Taking ca dn D and D level is tx  Encouraged more weight bearing exercise

## 2021-10-15 NOTE — Progress Notes (Signed)
Subjective:    Patient ID: Kelsey Haley, female    DOB: 1945/06/09, 76 y.o.   MRN: [id number]  HPI Here for health maintenance exam and to review chronic medical problems    Wt Readings from Last 3 Encounters:  10/15/21 148 lb 12.8 oz (67.5 kg)  09/24/21 148 lb 12.8 oz (67.5 kg)  10/04/19 144 lb 2 oz (65.4 kg)   27.38 kg/m  Feels good overall  Better than she was   Taking care of herself  Would like to get more exercise - is limited by low back pain  Was doing some water exercise and it helped her a lot     Immunization History  Administered Date(s) Administered   Influenza, High Dose Seasonal PF 11/23/2017   Influenza,inj,Quad PF,6+ Mos 11/09/2013, 12/20/2014   Influenza-Unspecified 12/17/2012, 12/14/2016   PFIZER(Purple Top)SARS-COV-2 Vaccination 05/13/2019, 06/08/2019   Pneumococcal Conjugate-13 11/09/2013   Pneumococcal Polysaccharide-23 06/02/2011   Td 03/17/2001   Tdap 06/02/2011   Zoster, Live 12/15/2012   Health Maintenance Due  Topic Date Due   Zoster Vaccines- Shingrix (1 of 2) Never done   COVID-19 Vaccine (3 - Pfizer series) 08/03/2019   MAMMOGRAM  08/01/2020   TETANUS/TDAP  06/01/2021   INFLUENZA VACCINE  10/15/2021   Tetanus postponed for insurance  Shingrix: had the vaccine last week and will get the next one before 6 months  At [facility]  Has had shingles before   Did not tolerate the covid vaccine-gave her vertigo   Mammogram 07/2019-plans ot schedule  Self breast exam: no lumps   Colonoscopy 08/2015-for 5 years -has not had it yet   Dexa 03/2018 stable osteopenia  Took course of alendronate several years ago  Falls : none since syncopal episode  Fractures: none  Supplements takes vitamin D and calcium  Exercise : water exercise  Lot of day to day walking and stairs  D level is tx at 58      10/15/2021    2:03 PM 07/29/2019   10:58 AM 02/09/2018   10:05 AM 11/12/2016    4:13 PM 05/16/2015   10:36 AM  Depression screen PHQ 2/9   Decreased Interest 0 0 0 0 0  Down, Depressed, Hopeless 0 0 0 0 0  PHQ - 2 Score 0 0 0 0 0  Altered sleeping  0     Tired, decreased energy  0     Change in appetite  0     Feeling bad or failure about yourself   0     Trouble concentrating  0     Moving slowly or fidgety/restless  0     Suicidal thoughts  0     PHQ-9 Score  0     Difficult doing work/chores  Not difficult at all         Hyperlipidemia Lab Results  Component Value Date   CHOL 218 (H) 10/02/2021   CHOL 202 (H) 10/04/2019   CHOL 234 (H) 07/29/2019   Lab Results  Component Value Date   HDL 67.60 10/02/2021   HDL 66.90 10/04/2019   HDL 74.80 07/29/2019   Lab Results  Component Value Date   LDLCALC 135 (H) 10/02/2021   LDLCALC 120 (H) 10/04/2019   LDLCALC 146 (H) 07/29/2019   Lab Results  Component Value Date   TRIG 78.0 10/02/2021   TRIG 77.0 10/04/2019   TRIG 64.0 07/29/2019   Lab Results  Component Value Date   CHOLHDL 3 10/02/2021   CHOLHDL  3 10/04/2019   CHOLHDL 3 07/29/2019   Lab Results  Component Value Date   LDLDIRECT 143.0 08/10/2012   LDLDIRECT 142.9 06/02/2011   LDLDIRECT 133.3 01/08/2010   No fried food or fatty foods  No red meat  Eats white meat  No shell fish  Low fat dairy only  No bacon or sausage   Parents have high cholesterol     Other labs Lab Results  Component Value Date   CREATININE 0.76 10/02/2021   BUN 22 10/02/2021   NA 139 10/02/2021   K 4.2 10/02/2021   CL 104 10/02/2021   CO2 27 10/02/2021   Lab Results  Component Value Date   ALT 13 10/02/2021   AST 21 10/02/2021   ALKPHOS 47 10/02/2021   BILITOT 0.7 10/02/2021   Lab Results  Component Value Date   WBC 5.0 10/02/2021   HGB 13.5 10/02/2021   HCT 40.6 10/02/2021   MCV 95.0 10/02/2021   PLT 256.0 10/02/2021   Lab Results  Component Value Date   TSH 3.13 10/02/2021    She takes multiple supplements   Patient Active Problem List   Diagnosis Date Noted   Syncope 09/24/2021    Pre-operative clearance 09/24/2021   Zoster 10/04/2019   Family history of uterine cancer 04/20/2017   Screening mammogram, encounter for 11/12/2016   Encounter for Medicare annual wellness exam 05/16/2015   Stress headaches 05/16/2015   Estrogen deficiency 05/16/2015   Lipoma 12/20/2014   Encounter for routine gynecological examination 08/16/2012   Routine general medical examination at a health care facility 08/10/2012   Vitamin D deficiency 01/08/2010   HYPERCHOLESTEROLEMIA 01/08/2010   COLONIC POLYPS 10/01/2009   Hearing loss 11/22/2008   INTERMITTENT VERTIGO 11/22/2008   Osteopenia 01/27/2007   Past Medical History:  Diagnosis Date   Encounter for Medicare annual wellness exam 05/16/2015   Estrogen deficiency 05/16/2015   Family history of uterine cancer 04/20/2017   Headache    occasional migranes   Hearing loss    Hearing loss 11/22/2008   Qualifier: Diagnosis of  By: Glori Bickers MD, [place] 01/08/2010   Qualifier: Diagnosis of  By: Joesph Fillers CMA (AAMA), Natasha     INTERMITTENT VERTIGO 11/22/2008   Qualifier: Diagnosis of  By: Glori Bickers MD, Carmell [place]    Lipoma 12/20/2014   On L back    Osteopenia    Rash and nonspecific skin eruption 05/16/2015   Atopic? -occ neck or L hand  Uses triamcinolone    Screening mammogram, encounter for 11/12/2016   Stress headaches 05/16/2015   Vertigo    Vitamin D deficiency 01/08/2010   Qualifier: Diagnosis of  By: Joesph Fillers CMA (AAMA), Ronny Bacon     Past Surgical History:  Procedure Laterality Date   COLONOSCOPY WITH PROPOFOL N/A 08/20/2015   Procedure: COLONOSCOPY WITH PROPOFOL;  Surgeon: Manya Silvas, MD;  Location: [facility] ENDOSCOPY;  Service: Endoscopy;  Laterality: N/A;   left knee surgery     TONSILLECTOMY     Social History   Tobacco Use   Smoking status: Former    Types: Cigarettes    Quit date: 03/17/1994    Years since quitting: 27.6   Smokeless tobacco: Never   Tobacco comments:    was a light smoker before quitting   Substance Use Topics   Alcohol use: Yes    Alcohol/week: 0.0 standard drinks of alcohol    Comment: occ-wine   Drug use: No   Family History  Problem Relation Age  of Onset   Uterine cancer Sister    Prostate cancer Father    Prostate cancer Brother    Breast cancer Neg Hx    No Known Allergies Current Outpatient Medications on File Prior to Visit  Medication Sig Dispense Refill   Ascorbic Acid (VITAMIN C) 1000 MG tablet Take 1,000 mg by mouth daily.     b complex vitamins capsule Take 1 capsule by mouth daily.     Calcium-Magnesium-Zinc 500-250-12.5 MG TABS Take 2 tablets by mouth daily.     Cholecalciferol (VITAMIN D-3) 5000 units TABS Take 1 tablet by mouth daily.     fluticasone (FLONASE) 50 MCG/ACT nasal spray SHAKE LIQUID AND USE 2 SPRAYS IN EACH NOSTRIL DAILY 16 g 5   KOREAN GINSENG PO Take 1 tablet by mouth daily.     Potassium Gluconate 550 MG TABS Take 1 tablet by mouth daily.     triamcinolone cream (KENALOG) 0.1 % Apply 1 application topically 2 (two) times daily. To affected areas for no more than 10 days (Patient not taking: Reported on 09/24/2021) 45 g 3   valACYclovir (VALTREX) 1000 MG tablet Take 1 tablet (1,000 mg total) by mouth 3 (three) times daily. (Patient not taking: Reported on 10/15/2021) 21 tablet 0   No current facility-administered medications on file prior to visit.    Review of Systems  Constitutional:  Negative for activity change, appetite change, fatigue, fever and unexpected weight change.  HENT:  Negative for congestion, ear pain, rhinorrhea, sinus pressure and sore throat.   Eyes:  Negative for pain, redness and visual disturbance.  Respiratory:  Negative for cough, shortness of breath and wheezing.   Cardiovascular:  Negative for chest pain and palpitations.  Gastrointestinal:  Negative for abdominal pain, blood in stool, constipation and diarrhea.  Endocrine: Negative for polydipsia and polyuria.  Genitourinary:  Negative for dysuria, frequency  and urgency.  Musculoskeletal:  Negative for arthralgias, back pain and myalgias.  Skin:  Negative for pallor and rash.  Allergic/Immunologic: Negative for environmental allergies.  Neurological:  Negative for dizziness, syncope and headaches.       No more syncope or pre syncope  Hematological:  Negative for adenopathy. Does not bruise/bleed easily.  Psychiatric/Behavioral:  Negative for decreased concentration and dysphoric mood. The patient is not nervous/anxious.        Objective:   Physical Exam Constitutional:      General: She is not in acute distress.    Appearance: Normal appearance. She is well-developed and normal weight. She is not ill-appearing or diaphoretic.  HENT:     Head: Normocephalic and atraumatic.     Right Ear: Tympanic membrane, ear canal and external ear normal.     Left Ear: Tympanic membrane, ear canal and external ear normal.     Nose: Nose normal. No congestion.     Mouth/Throat:     Mouth: Mucous membranes are moist.     Pharynx: Oropharynx is clear. No posterior oropharyngeal erythema.  Eyes:     General: No scleral icterus.    Extraocular Movements: Extraocular movements intact.     Conjunctiva/sclera: Conjunctivae normal.     Pupils: Pupils are equal, round, and reactive to light.  Neck:     Thyroid: No thyromegaly.     Vascular: No carotid bruit or JVD.  Cardiovascular:     Rate and Rhythm: Normal rate and regular rhythm.     Pulses: Normal pulses.     Heart sounds: Normal heart sounds.  No gallop.  Pulmonary:     Effort: Pulmonary effort is normal. No respiratory distress.     Breath sounds: Normal breath sounds. No wheezing.     Comments: Good air exch Chest:     Chest wall: No tenderness.  Abdominal:     General: Bowel sounds are normal. There is no distension or abdominal bruit.     Palpations: Abdomen is soft. There is no mass.     Tenderness: There is no abdominal tenderness.     Hernia: No hernia is present.  Genitourinary:     Comments: Breast exam: No mass, nodules, thickening, tenderness, bulging, retraction, inflamation, nipple discharge or skin changes noted.  No axillary or clavicular LA.     Musculoskeletal:        General: No tenderness. Normal range of motion.     Cervical back: Normal range of motion and neck supple. No rigidity. No muscular tenderness.     Right lower leg: No edema.     Left lower leg: No edema.     Comments: No kyphosis   Lymphadenopathy:     Cervical: No cervical adenopathy.  Skin:    General: Skin is warm and dry.     Coloration: Skin is not pale.     Findings: No erythema or rash.     Comments: Solar lentigines diffusely   Neurological:     Mental Status: She is alert. Mental status is at baseline.     Cranial Nerves: No cranial nerve deficit.     Motor: No abnormal muscle tone.     Coordination: Coordination normal.     Gait: Gait normal.     Deep Tendon Reflexes: Reflexes are normal and symmetric. Reflexes normal.  Psychiatric:        Mood and Affect: Mood normal.        Cognition and Memory: Cognition and memory normal.           Assessment & Plan:   Problem List Items Addressed This Visit       Digestive   COLONIC POLYPS    Referred for recall colonoscopy  Pt will call to schedule      Relevant Orders   Ambulatory referral to Gastroenterology     Musculoskeletal and Integument   Osteopenia    dexa ordered No falls (except for a syncopal episode)  No fractures Taking ca dn D and D level is tx  Encouraged more weight bearing exercise         Other   Estrogen deficiency   Relevant Orders   DG Bone Density   HYPERCHOLESTEROLEMIA    Disc goals for lipids and reasons to control them Rev last labs with pt Rev low sat fat diet in detail LDL up slt to 135 Ratio unchanged  Very good diet  Suspect genetic  May need statin in the future       Routine general medical examination at a health care facility - Primary    Reviewed health habits  including diet and exercise and skin cancer prevention Reviewed appropriate screening tests for age  Also reviewed health mt list, fam hx and immunization status , as well as social and family history   See HPI Labs reviewed  Tetanus shot postponed for insurance  Had first shingrix -pending next in 6 mo Mammogram ordered/pt will schedule Colonoscopy ordered for 5 y recall  dexa ordered/taking ca and D Pending cardiology visit next mo for syncope Disc fallprecautions  Screening mammogram, encounter for   Relevant Orders   MM 3D SCREEN BREAST BILATERAL   Vitamin D deficiency

## 2021-10-15 NOTE — Assessment & Plan Note (Signed)
Disc goals for lipids and reasons to control them Rev last labs with pt Rev low sat fat diet in detail LDL up slt to 135 Ratio unchanged  Very good diet  Suspect genetic  May need statin in the future

## 2021-10-15 NOTE — Assessment & Plan Note (Signed)
Referred for recall colonoscopy  Pt will call to schedule

## 2021-10-15 NOTE — Assessment & Plan Note (Signed)
Reviewed health habits including diet and exercise and skin cancer prevention Reviewed appropriate screening tests for age  Also reviewed health mt list, fam hx and immunization status , as well as social and family history   See HPI Labs reviewed  Tetanus shot postponed for insurance  Had first shingrix -pending next in 6 mo Mammogram ordered/pt will schedule Colonoscopy ordered for 5 y recall  dexa ordered/taking ca and D Pending cardiology visit next mo for syncope Disc fallprecautions

## 2021-10-15 NOTE — Patient Instructions (Addendum)
Do what ever exercise you can with your back pain  Yoga is a great idea (or chair yoga)    I recommend a flu shot in the fall   We will keep watching the cholesterol   Try to get 1200-1500 mg of calcium per day with at least 1000 iu of vitamin D - for bone health  For cholesterol  Avoid red meat/ fried foods/ egg yolks/ fatty breakfast meats/ butter, cheese and high fat dairy/ and shellfish    Call and schedule your bone density test and your mammogram at [facility]  Please call the location of your choice from the menu below to schedule your Mammogram and/or Bone Density appointment.    [facility]                      Phone:  [phone], [place] [address]                                                             Services: Traditional and 3D Mammogram, [place] [facility]                 Phone: [phone] [address], [place] [address]    Service: Bone Density ONLY   *this site does NOT perform mammograms  [facility]                        Phone:  [phone] [address], [place] [address]                                            Services:  3D Mammogram and [place] at [facility]   Phone:  [phone]   [address] [place], [place] [address]  Services: 3D Mammogram and Bone Density  [place] [facility])  Phone:  [phone]   [address] Room [place], [place] [address]                                              Services:  3D Mammogram  and Bone Density   Call and schedule your colonoscopy   [facility]  [phone]

## 2021-10-16 ENCOUNTER — Other Ambulatory Visit: Payer: Self-pay

## 2021-10-16 ENCOUNTER — Telehealth: Payer: Self-pay

## 2021-10-16 DIAGNOSIS — Z86010 Personal history of colonic polyps: Secondary | ICD-10-CM

## 2021-10-16 MED ORDER — NA SULFATE-K SULFATE-MG SULF 17.5-3.13-1.6 GM/177ML PO SOLN: 1.0000 | Freq: Once | ORAL | 0 refills | Status: AC

## 2021-10-16 NOTE — Telephone Encounter (Signed)
Gastroenterology Pre-Procedure Review  Request Date: 02/18/22 Requesting Physician: Dr. Marius Ditch  PATIENT REVIEW QUESTIONS: The patient responded to the following health history questions as indicated:    1. Are you having any GI issues? no 2. Do you have a personal history of Polyps? yes (6 years ago) 3. Do you have a family history of Colon Cancer or Polyps? no 4. Diabetes Mellitus? no 5. Joint replacements in the past 12 months?no 6. Major health problems in the past 3 months?yes (5 weeks ago in [place], low BP) 7. Any artificial heart valves, MVP, or defibrillator?no    MEDICATIONS & ALLERGIES:    Patient reports the following regarding taking any anticoagulation/antiplatelet therapy:   Plavix, Coumadin, Eliquis, Xarelto, Lovenox, Pradaxa, Brilinta, or Effient? no Aspirin? no  Patient confirms/reports the following medications:  Current Outpatient Medications  Medication Sig Dispense Refill   Ascorbic Acid (VITAMIN C) 1000 MG tablet Take 1,000 mg by mouth daily.     b complex vitamins capsule Take 1 capsule by mouth daily.     Calcium-Magnesium-Zinc 500-250-12.5 MG TABS Take 2 tablets by mouth daily.     Cholecalciferol (VITAMIN D-3) 5000 units TABS Take 1 tablet by mouth daily.     fluticasone (FLONASE) 50 MCG/ACT nasal spray SHAKE LIQUID AND USE 2 SPRAYS IN EACH NOSTRIL DAILY 16 g 5   KOREAN GINSENG PO Take 1 tablet by mouth daily.     Potassium Gluconate 550 MG TABS Take 1 tablet by mouth daily.     triamcinolone cream (KENALOG) 0.1 % Apply 1 application topically 2 (two) times daily. To affected areas for no more than 10 days (Patient not taking: Reported on 09/24/2021) 45 g 3   valACYclovir (VALTREX) 1000 MG tablet Take 1 tablet (1,000 mg total) by mouth 3 (three) times daily. (Patient not taking: Reported on 10/15/2021) 21 tablet 0   No current facility-administered medications for this visit.    Patient confirms/reports the following allergies:  No Known Allergies  No orders of  the defined types were placed in this encounter.   AUTHORIZATION INFORMATION Primary Insurance: 1D#: Group #:  Secondary Insurance: 1D#: Group #:  SCHEDULE INFORMATION: Date: 02/18/22 Time: Location: [facility]

## 2021-10-22 ENCOUNTER — Other Ambulatory Visit: Payer: Self-pay | Admitting: Family Medicine

## 2021-11-20 ENCOUNTER — Ambulatory Visit (INDEPENDENT_AMBULATORY_CARE_PROVIDER_SITE_OTHER): Payer: PPO

## 2021-11-20 VITALS — Wt 148.0 lb

## 2021-11-20 DIAGNOSIS — Z0000 Encounter for general adult medical examination without abnormal findings: Secondary | ICD-10-CM | POA: Diagnosis not present

## 2021-11-20 NOTE — Patient Instructions (Signed)
[name] , Thank you for taking time to come for your Medicare Wellness Visit. I appreciate your ongoing commitment to your health goals. Please review the following plan we discussed and let me know if I can assist you in the future.   Screening recommendations/referrals: Colonoscopy: 08/20/15 Mammogram: scheduled 01/15/22 Bone Density: 03/23/18 Recommended yearly ophthalmology/optometry visit for glaucoma screening and checkup Recommended yearly dental visit for hygiene and checkup  Vaccinations: Influenza vaccine: n/d Pneumococcal vaccine: 11/09/13 Tdap vaccine: 06/02/11, due if have injury Shingles vaccine: Zostavax 12/15/12   Covid-19:05/13/19, 06/05/19  Advanced directives: no  Conditions/risks identified: none  Next appointment: Follow up in one year for your annual wellness visit 11/24/22 @ 3:15 pm by phone   Preventive Care 65 Years and Older, Female Preventive care refers to lifestyle choices and visits with your health care provider that can promote health and wellness. What does preventive care include? A yearly physical exam. This is also called an annual well check. Dental exams once or twice a year. Routine eye exams. Ask your health care provider how often you should have your eyes checked. Personal lifestyle choices, including: Daily care of your teeth and gums. Regular physical activity. Eating a healthy diet. Avoiding tobacco and drug use. Limiting alcohol use. Practicing safe sex. Taking low-dose aspirin every day. Taking vitamin and mineral supplements as recommended by your health care provider. What happens during an annual well check? The services and screenings done by your health care provider during your annual well check will depend on your age, overall health, lifestyle risk factors, and family history of disease. Counseling  Your health care provider may ask you questions about your: Alcohol use. Tobacco use. Drug use. Emotional well-being. Home and  relationship well-being. Sexual activity. Eating habits. History of falls. Memory and ability to understand (cognition). Work and work Statistician. Reproductive health. Screening  You may have the following tests or measurements: Height, weight, and BMI. Blood pressure. Lipid and cholesterol levels. These may be checked every 5 years, or more frequently if you are over 70 years old. Skin check. Lung cancer screening. You may have this screening every year starting at age 1 if you have a 30-pack-year history of smoking and currently smoke or have quit within the past 15 years. Fecal occult blood test (FOBT) of the stool. You may have this test every year starting at age 45. Flexible sigmoidoscopy or colonoscopy. You may have a sigmoidoscopy every 5 years or a colonoscopy every 10 years starting at age 65. Hepatitis C blood test. Hepatitis B blood test. Sexually transmitted disease (STD) testing. Diabetes screening. This is done by checking your blood sugar (glucose) after you have not eaten for a while (fasting). You may have this done every 1-3 years. Bone density scan. This is done to screen for osteoporosis. You may have this done starting at age 87. Mammogram. This may be done every 1-2 years. Talk to your health care provider about how often you should have regular mammograms. Talk with your health care provider about your test results, treatment options, and if necessary, the need for more tests. Vaccines  Your health care provider may recommend certain vaccines, such as: Influenza vaccine. This is recommended every year. Tetanus, diphtheria, and acellular pertussis (Tdap, Td) vaccine. You may need a Td booster every 10 years. Zoster vaccine. You may need this after age 90. Pneumococcal 13-valent conjugate (PCV13) vaccine. One dose is recommended after age 12. Pneumococcal polysaccharide (PPSV23) vaccine. One dose is recommended after age 88.  Talk to your health care provider  about which screenings and vaccines you need and how often you need them. This information is not intended to replace advice given to you by your health care provider. Make sure you discuss any questions you have with your health care provider. Document Released: 03/30/2015 Document Revised: 11/21/2015 Document Reviewed: 01/02/2015 Elsevier Interactive Patient Education  2017 [place] Prevention in the Home Falls can cause injuries. They can happen to people of all ages. There are many things you can do to make your home safe and to help prevent falls. What can I do on the outside of my home? Regularly fix the edges of walkways and driveways and fix any cracks. Remove anything that might make you trip as you walk through a door, such as a raised step or threshold. Trim any bushes or trees on the path to your home. Use bright outdoor lighting. Clear any walking paths of anything that might make someone trip, such as rocks or tools. Regularly check to see if handrails are loose or broken. Make sure that both sides of any steps have handrails. Any raised decks and porches should have guardrails on the edges. Have any leaves, snow, or ice cleared regularly. Use sand or salt on walking paths during winter. Clean up any spills in your garage right away. This includes oil or grease spills. What can I do in the bathroom? Use night lights. Install grab bars by the toilet and in the tub and shower. Do not use towel bars as grab bars. Use non-skid mats or decals in the tub or shower. If you need to sit down in the shower, use a plastic, non-slip stool. Keep the floor dry. Clean up any water that spills on the floor as soon as it happens. Remove soap buildup in the tub or shower regularly. Attach bath mats securely with double-sided non-slip rug tape. Do not have throw rugs and other things on the floor that can make you trip. What can I do in the bedroom? Use night lights. Make sure  that you have a light by your bed that is easy to reach. Do not use any sheets or blankets that are too big for your bed. They should not hang down onto the floor. Have a firm chair that has side arms. You can use this for support while you get dressed. Do not have throw rugs and other things on the floor that can make you trip. What can I do in the kitchen? Clean up any spills right away. Avoid walking on wet floors. Keep items that you use a lot in easy-to-reach places. If you need to reach something above you, use a strong step stool that has a grab bar. Keep electrical cords out of the way. Do not use floor polish or wax that makes floors slippery. If you must use wax, use non-skid floor wax. Do not have throw rugs and other things on the floor that can make you trip. What can I do with my stairs? Do not leave any items on the stairs. Make sure that there are handrails on both sides of the stairs and use them. Fix handrails that are broken or loose. Make sure that handrails are as long as the stairways. Check any carpeting to make sure that it is firmly attached to the stairs. Fix any carpet that is loose or worn. Avoid having throw rugs at the top or bottom of the stairs. If you do have throw  rugs, attach them to the floor with carpet tape. Make sure that you have a light switch at the top of the stairs and the bottom of the stairs. If you do not have them, ask someone to add them for you. What else can I do to help prevent falls? Wear shoes that: Do not have high heels. Have rubber bottoms. Are comfortable and fit you well. Are closed at the toe. Do not wear sandals. If you use a stepladder: Make sure that it is fully opened. Do not climb a closed stepladder. Make sure that both sides of the stepladder are locked into place. Ask someone to hold it for you, if possible. Clearly mark and make sure that you can see: Any grab bars or handrails. First and last steps. Where the edge of  each step is. Use tools that help you move around (mobility aids) if they are needed. These include: Canes. Walkers. Scooters. Crutches. Turn on the lights when you go into a dark area. Replace any light bulbs as soon as they burn out. Set up your furniture so you have a clear path. Avoid moving your furniture around. If any of your floors are uneven, fix them. If there are any pets around you, be aware of where they are. Review your medicines with your doctor. Some medicines can make you feel dizzy. This can increase your chance of falling. Ask your doctor what other things that you can do to help prevent falls. This information is not intended to replace advice given to you by your health care provider. Make sure you discuss any questions you have with your health care provider. Document Released: 12/28/2008 Document Revised: 08/09/2015 Document Reviewed: 04/07/2014 Elsevier Interactive Patient Education  2017 [employer].

## 2021-11-20 NOTE — Progress Notes (Signed)
Virtual Visit via Telephone Note  I connected with  Kelsey Haley on 11/20/21 at  2:30 PM EDT by telephone and verified that I am speaking with the correct person using two identifiers.  Location: Patient: home Provider: [facility] [place] Persons participating in the virtual visit: patient/Nurse Health Advisor   I discussed the limitations, risks, security and privacy concerns of performing an evaluation and management service by telephone and the availability of in person appointments. The patient expressed understanding and agreed to proceed.  Interactive audio and video telecommunications were attempted between this nurse and patient, however failed, due to patient having technical difficulties OR patient did not have access to video capability.  We continued and completed visit with audio only.  Some vital signs may be absent or patient reported.   Dionisio David, LPN  Subjective:   Kelsey Haley is a 76 y.o. female who presents for Medicare Annual (Subsequent) preventive examination.  Review of Systems     Cardiac Risk Factors include: advanced age (>64mn, >>28women)     Objective:    Today's Vitals   11/20/21 1440  Weight: 148 lb (67.1 kg)   Body mass index is 27.24 kg/m.     11/20/2021    2:32 PM  Advanced Directives  Does Patient Have a Medical Advance Directive? No  Would patient like information on creating a medical advance directive? No - Patient declined    Current Medications (verified) Outpatient Encounter Medications as of 11/20/2021  Medication Sig   Ascorbic Acid (VITAMIN C) 1000 MG tablet Take 1,000 mg by mouth daily.   b complex vitamins capsule Take 1 capsule by mouth daily.   Calcium-Magnesium-Zinc 500-250-12.5 MG TABS Take 2 tablets by mouth daily.   Cholecalciferol (VITAMIN D-3) 5000 units TABS Take 1 tablet by mouth daily.   fluticasone (FLONASE) 50 MCG/ACT nasal spray SHAKE LIQUID AND USE 2 SPRAYS IN EACH NOSTRIL DAILY   KOREAN GINSENG PO Take 1  tablet by mouth daily.   Potassium Gluconate 550 MG TABS Take 1 tablet by mouth daily.   triamcinolone cream (KENALOG) 0.1 % Apply 1 application topically 2 (two) times daily. To affected areas for no more than 10 days   valACYclovir (VALTREX) 1000 MG tablet Take 1 tablet (1,000 mg total) by mouth 3 (three) times daily.   ofloxacin (OCUFLOX) 0.3 % ophthalmic solution Place into the right eye. (Patient not taking: Reported on 11/20/2021)   prednisoLONE acetate (PRED FORTE) 1 % ophthalmic suspension SMARTSIG:In Eye(s) (Patient not taking: Reported on 11/20/2021)   No facility-administered encounter medications on file as of 11/20/2021.    Allergies (verified) Patient has no known allergies.   History: Past Medical History:  Diagnosis Date   Encounter for Medicare annual wellness exam 05/16/2015   Estrogen deficiency 05/16/2015   Family history of uterine cancer 04/20/2017   Headache    occasional migranes   Hearing loss    Hearing loss 11/22/2008   Qualifier: Diagnosis of  By: TGlori BickersMD, MTillatoba10/25/2011   Qualifier: Diagnosis of  By: CJoesph FillersCMA (AAMA), Natasha     INTERMITTENT VERTIGO 11/22/2008   Qualifier: Diagnosis of  By: TGlori BickersMD, MCarmell Austria   Lipoma 12/20/2014   On L back    Osteopenia    Rash and nonspecific skin eruption 05/16/2015   Atopic? -occ neck or L hand  Uses triamcinolone    Screening mammogram, encounter for 11/12/2016   Stress headaches 05/16/2015   Vertigo  Vitamin D deficiency 01/08/2010   Qualifier: Diagnosis of  By: Joesph Fillers CMA (AAMA), Ronny Bacon     Past Surgical History:  Procedure Laterality Date   COLONOSCOPY WITH PROPOFOL N/A 08/20/2015   Procedure: COLONOSCOPY WITH PROPOFOL;  Surgeon: Manya Silvas, MD;  Location: [facility] ENDOSCOPY;  Service: Endoscopy;  Laterality: N/A;   left knee surgery     TONSILLECTOMY     Family History  Problem Relation Age of Onset   Uterine cancer Sister    Prostate cancer Father    Prostate cancer Brother     Breast cancer Neg Hx    Social History   Socioeconomic History   Marital status: Married    Spouse name: Not on file   Number of children: Not on file   Years of education: Not on file   Highest education level: Not on file  Occupational History   Not on file  Tobacco Use   Smoking status: Former    Types: Cigarettes    Quit date: 03/17/1994    Years since quitting: 27.6   Smokeless tobacco: Never   Tobacco comments:    was a light smoker before quitting  Substance and Sexual Activity   Alcohol use: Yes    Alcohol/week: 0.0 standard drinks of alcohol    Comment: occ-wine   Drug use: No   Sexual activity: Not on file  Other Topics Concern   Not on file  Social History Narrative   Not on file   Social Determinants of Health   Financial Resource Strain: Low Risk  (11/20/2021)   Overall Financial Resource Strain (CARDIA)    Difficulty of Paying Living Expenses: Not very hard  Food Insecurity: No Food Insecurity (11/20/2021)   Hunger Vital Sign    Worried About Running Out of Food in the Last Year: Never true    Ran Out of Food in the Last Year: Never true  Transportation Needs: No Transportation Needs (11/20/2021)   PRAPARE - Hydrologist (Medical): No    Lack of Transportation (Non-Medical): No  Physical Activity: Insufficiently Active (11/20/2021)   Exercise Vital Sign    Days of Exercise per Week: 3 days    Minutes of Exercise per Session: 30 min  Stress: No Stress Concern Present (11/20/2021)   [place]    Feeling of Stress : Not at all  Social Connections: Moderately Isolated (11/20/2021)   Social Connection and Isolation Panel [NHANES]    Frequency of Communication with Friends and Family: More than three times a week    Frequency of Social Gatherings with Friends and Family: More than three times a week    Attends Religious Services: Never    Marine scientist or  Organizations: No    Attends Music therapist: Never    Marital Status: Married    Tobacco Counseling Counseling given: Not Answered Tobacco comments: was a light smoker before quitting   Clinical Intake:  Pre-visit preparation completed: Yes  Pain : No/denies pain     Nutritional Risks: None Diabetes: No  How often do you need to have someone help you when you read instructions, pamphlets, or other written materials from your doctor or pharmacy?: 1 - Never  Diabetic?no     Information entered by :: Kirke Shaggy, LPN   Activities of Daily Living    11/20/2021    2:33 PM  In your present state of health, do you have  any difficulty performing the following activities:  Hearing? 0  Vision? 0  Difficulty concentrating or making decisions? 0  Walking or climbing stairs? 0  Dressing or bathing? 0  Doing errands, shopping? 0  Preparing Food and eating ? N  Using the Toilet? N  In the past six months, have you accidently leaked urine? N  Do you have problems with loss of bowel control? N  Managing your Medications? N  Managing your Finances? N  Housekeeping or managing your Housekeeping? N    Patient Care Team: Tower, Wynelle Fanny, MD as PCP - General  Indicate any recent Medical Services you may have received from other than [facility] providers in the past year (date may be approximate).     Assessment:   This is a routine wellness examination for [facility].  Hearing/Vision screen Hearing Screening - Comments:: No aids Vision Screening - Comments:: Wears glasses- [facility]  Dietary issues and exercise activities discussed: Current Exercise Habits: Home exercise routine, Type of exercise: walking, Time (Minutes): 30, Frequency (Times/Week): 3, Weekly Exercise (Minutes/Week): 90, Intensity: Mild   Goals Addressed             This Visit's Progress    DIET - EAT MORE FRUITS AND VEGETABLES         Depression Screen    11/20/2021    2:30 PM 10/15/2021     2:03 PM 07/29/2019   10:58 AM 02/09/2018   10:05 AM 11/12/2016    4:13 PM 05/16/2015   10:36 AM 11/09/2013   11:09 AM  PHQ 2/9 Scores  PHQ - 2 Score 0 0 0 0 0 0 0  PHQ- 9 Score 0  0        Fall Risk    11/20/2021    2:33 PM 10/15/2021    2:03 PM 07/29/2019   10:36 AM 02/09/2018   10:05 AM 11/12/2016    4:13 PM  Fall Risk   Falls in the past year? 0 0 0 1 Yes  Number falls in past yr: 0  0 0 1  Injury with Fall? 0  0  No  Risk for fall due to : No Fall Risks      Follow up Falls evaluation completed Falls evaluation completed       [place]:  Any stairs in or around the home? No  If so, are there any without handrails? No  Home free of loose throw rugs in walkways, pet beds, electrical cords, etc? Yes  Adequate lighting in your home to reduce risk of falls? Yes   ASSISTIVE DEVICES UTILIZED TO PREVENT FALLS:  Life alert? No  Use of a cane, walker or w/c? No  Grab bars in the bathroom? No  Shower chair or bench in shower? No  Elevated toilet seat or a handicapped toilet? No   Cognitive Function:unable to perform 2023 PT IS A & O        Immunizations Immunization History  Administered Date(s) Administered   Influenza, High Dose Seasonal PF 11/23/2017, 11/23/2017   Influenza,inj,Quad PF,6+ Mos 11/09/2013, 12/20/2014, 12/28/2018   Influenza-Unspecified 12/17/2012, 12/14/2016   PFIZER(Purple Top)SARS-COV-2 Vaccination 05/13/2019, 06/08/2019   Pneumococcal Conjugate-13 11/09/2013   Pneumococcal Polysaccharide-23 06/02/2011   Td 03/17/2001   Tdap 06/02/2011   Zoster, Live 12/15/2012    TDAP status: Due, Education has been provided regarding the importance of this vaccine. Advised may receive this vaccine at local pharmacy or Health Dept. Aware to provide a copy  of the vaccination record if obtained from local pharmacy or Health Dept. Verbalized acceptance and understanding.  Flu Vaccine status: Declined, Education has been provided  regarding the importance of this vaccine but patient still declined. Advised may receive this vaccine at local pharmacy or Health Dept. Aware to provide a copy of the vaccination record if obtained from local pharmacy or Health Dept. Verbalized acceptance and understanding.  Pneumococcal vaccine status: Up to date  Covid-19 vaccine status: Completed vaccines  Qualifies for Shingles Vaccine? Yes   Zostavax completed Yes   Shingrix Completed?: No.    Education has been provided regarding the importance of this vaccine. Patient has been advised to call insurance company to determine out of pocket expense if they have not yet received this vaccine. Advised may also receive vaccine at local pharmacy or Health Dept. Verbalized acceptance and understanding.  Screening Tests Health Maintenance  Topic Date Due   COVID-19 Vaccine (3 - Pfizer series) 08/03/2019   MAMMOGRAM  08/01/2020   INFLUENZA VACCINE  10/15/2021   Zoster Vaccines- Shingrix (1 of 2) 01/15/2022 (Originally 03/06/1996)   TETANUS/TDAP  10/16/2031 (Originally 06/01/2021)   COLONOSCOPY (Pts 45-5yr Insurance coverage will need to be confirmed)  08/19/2025   Pneumonia Vaccine 76 Years old  Completed   DEXA SCAN  Completed   Hepatitis C Screening  Completed   HPV VACCINES  Aged Out    Health Maintenance  Health Maintenance Due  Topic Date Due   COVID-19 Vaccine (3 - Pfizer series) 08/03/2019   MAMMOGRAM  08/01/2020   INFLUENZA VACCINE  10/15/2021    Colorectal cancer screening: Type of screening: Colonoscopy. Completed 08/20/15. Repeat every 10 years  Mammogram status: Ordered 01/15/22 is her appt. Pt provided with contact info and advised to call to schedule appt.   Bone Density status: Completed 03/23/18. Results reflect: Bone density results: OSTEOPENIA. Repeat every 5 years.  Lung Cancer Screening: (Low Dose CT Chest recommended if Age 326-80years, 30 pack-year currently smoking OR have quit w/in 15years.) does not qualify.    Additional Screening:  Hepatitis C Screening: does qualify; Completed 05/16/15  Vision Screening: Recommended annual ophthalmology exams for early detection of glaucoma and other disorders of the eye. Is the patient up to date with their annual eye exam?  Yes  Who is the provider or what is the name of the office in which the patient attends annual eye exams? [facility] pt is not established with a provider, would they like to be referred to a provider to establish care? No .   Dental Screening: Recommended annual dental exams for proper oral hygiene  Community Resource Referral / Chronic Care Management: CRR required this visit?  No   CCM required this visit?  No      Plan:     I have personally reviewed and noted the following in the patient's chart:   Medical and social history Use of alcohol, tobacco or illicit drugs  Current medications and supplements including opioid prescriptions. Patient is not currently taking opioid prescriptions. Functional ability and status Nutritional status Physical activity Advanced directives List of other physicians Hospitalizations, surgeries, and ER visits in previous 12 months Vitals Screenings to include cognitive, depression, and falls Referrals and appointments  In addition, I have reviewed and discussed with patient certain preventive protocols, quality metrics, and best practice recommendations. A written personalized care plan for preventive services as well as general preventive health recommendations were provided to patient.     LDionisio David  LPN   [id number]   Nurse Notes: none

## 2021-11-25 NOTE — Progress Notes (Unsigned)
New Outpatient Visit Date: 11/27/2021  Referring Provider: Abner Greenspan, MD [place],  [place] [id number]  Chief Complaint: Lightheadedness/near-syncope  HPI:  Kelsey Haley is a 76 y.o. female who is being seen today for the evaluation of near-syncope at the request of Dr. Glori Bickers. She has a history of recurrent lightheadedness/near-syncope, hyperlipidemia, intermittent vertigo, migraine headaches, and back lipoma.  Kelsey Haley was vacationing in [place] in June when she had a near syncopal event that led to hospitalization in [place], [place].  On the day of the event, she had felt weak with a poor appetite.  She admits to not having had as much to eat or drink as usual.  She sat down at a picnic table and asked her husband to get her something to drink.  As she felt lightheaded, she leaned her head on the table.  She then sat up and almost completely blacked out, falling backwards and hitting her head on the ground.  She did not pass out completely.  She has had similar episodes in the past and notes that she gets significant lightheadedness every few weeks.  Hospital course was unremarkable, with cardiac biomarkers being negative.  Echocardiogram showed normal LVEF with grade 1 diastolic dysfunction and mild aortic and mitral regurgitation.  Her near syncopal episode was felt to be vasovagal in nature.  She reports also having had significant vertigo in the past though this recent episode was more consistent with lightheadedness than vertigo.  Today, Kelsey Haley reports feeling fairly well.  She denies having any chest pain, shortness of breath, or edema.  In the mornings, she usually needs at least 1 or 2 cups of coffee to get going.  Her energy improves as the day goes on.  She has been trying to stay well-hydrated though she admits that she sometimes does not drink enough water.  Kelsey Haley notes that her blood pressure is usually low normal at home.  Sometimes when her blood pressure is low and  she feels particularly weak, she will eat salt.  She does not have any lightheadedness with activity.  She seems to be quite sensitive to the heat.  --------------------------------------------------------------------------------------------------  Cardiovascular History & Procedures: Cardiovascular Problems: Near-syncope  Risk Factors: Hyperlipidemia and age greater than 96  Cath/PCI: None  CV Surgery: None  EP Procedures and Devices: None  Non-Invasive Evaluation(s): TTE (09/08/2021, [facility]): Normal LV size and wall thickness.  LVEF 55-60% with grade 1 diastolic dysfunction.  No regional wall motion abnormality.  Normal RV size and function.  Mild mitral and aortic regurgitation.  Recent CV Pertinent Labs: Lab Results  Component Value Date   CHOL 218 (H) 10/02/2021   HDL 67.60 10/02/2021   LDLCALC 135 (H) 10/02/2021   LDLDIRECT 143.0 08/10/2012   TRIG 78.0 10/02/2021   CHOLHDL 3 10/02/2021   K 4.2 10/02/2021   BUN 22 10/02/2021   CREATININE 0.76 10/02/2021    --------------------------------------------------------------------------------------------------  Past Medical History:  Diagnosis Date   Encounter for Medicare annual wellness exam 05/16/2015   Estrogen deficiency 05/16/2015   Family history of uterine cancer 04/20/2017   Headache    occasional migranes   Hearing loss    Hearing loss 11/22/2008   Qualifier: Diagnosis of  By: Glori Bickers MD, [place] 01/08/2010   Qualifier: Diagnosis of  By: Joesph Fillers CMA (AAMA), Natasha     INTERMITTENT VERTIGO 11/22/2008   Qualifier: Diagnosis of  By: Glori Bickers MD, Carmell Austria  Lipoma 12/20/2014   On L back    Osteopenia    Rash and nonspecific skin eruption 05/16/2015   Atopic? -occ neck or L hand  Uses triamcinolone    Screening mammogram, encounter for 11/12/2016   Stress headaches 05/16/2015   Vertigo    Vitamin D deficiency 01/08/2010   Qualifier: Diagnosis of  By: Joesph Fillers CMA (AAMA), Ronny Bacon       Past Surgical History:  Procedure Laterality Date   COLONOSCOPY WITH PROPOFOL N/A 08/20/2015   Procedure: COLONOSCOPY WITH PROPOFOL;  Surgeon: Manya Silvas, MD;  Location: [facility] ENDOSCOPY;  Service: Endoscopy;  Laterality: N/A;   left knee surgery     TONSILLECTOMY      Current Meds  Medication Sig   Ascorbic Acid (VITAMIN C) 1000 MG tablet Take 1,000 mg by mouth daily.   b complex vitamins capsule Take 1 capsule by mouth daily.   Calcium-Magnesium-Zinc 500-250-12.5 MG TABS Take 2 tablets by mouth daily.   Cholecalciferol (VITAMIN D-3) 5000 units TABS Take 1 tablet by mouth daily.   Potassium Gluconate 550 MG TABS Take 1 tablet by mouth daily.    Allergies: Patient has no known allergies.  Social History   Tobacco Use   Smoking status: Former    Types: Cigarettes    Quit date: 03/17/1994    Years since quitting: 27.7   Smokeless tobacco: Never   Tobacco comments:    was a light smoker before quitting  Vaping Use   Vaping Use: Never used  Substance Use Topics   Alcohol use: Yes    Alcohol/week: 0.0 standard drinks of alcohol    Comment: occ-wine   Drug use: No    Family History  Problem Relation Age of Onset   Uterine cancer Sister    Prostate cancer Father    Prostate cancer Brother    Breast cancer Neg Hx     Review of Systems: A 12-system review of systems was performed and was negative except as noted in the HPI.  --------------------------------------------------------------------------------------------------  Physical Exam: BP 137/81 (BP Location: Left Arm, Patient Position: Sitting, Cuff Size: Normal)   Pulse 70   Ht '5\' 2"'$  (1.575 m)   Wt 148 lb (67.1 kg)   SpO2 98%   BMI 27.07 kg/m  Position Blood pressure (mmHg) Heart rate (bpm)  Lying 124/86 89  Sitting 134/87 66  Standing 130/69 54  Standing (3 minutes) 137/86 75   General:  NAD.  Accompanied by her daughter. HEENT: No conjunctival pallor or scleral icterus. Facemask in place. Neck:  Supple without lymphadenopathy, thyromegaly, JVD, or HJR. No carotid bruit. Lungs: Normal work of breathing. Clear to auscultation bilaterally without wheezes or crackles. Heart: Regular rate and rhythm with occasional extrasystoles.  1/6 systolic murmur noted. Non-displaced PMI. Abd: Bowel sounds present. Soft, NT/ND without hepatosplenomegaly Ext: No lower extremity edema. Radial, PT, and DP pulses are 2+ bilaterally Skin: Warm and dry without rash. Neuro: CNIII-XII intact. Strength and fine-touch sensation intact in upper and lower extremities bilaterally. Psych: Normal mood and affect.  EKG: Normal sinus rhythm with PACs and PVCs as well as nonspecific ST segment changes.  Compared with prior tracing from 09/24/2021, heart rate has increased and PACs/PVCs are now present.  Lab Results  Component Value Date   WBC 5.0 10/02/2021   HGB 13.5 10/02/2021   HCT 40.6 10/02/2021   MCV 95.0 10/02/2021   PLT 256.0 10/02/2021    Lab Results  Component Value Date   NA 139 10/02/2021  K 4.2 10/02/2021   CL 104 10/02/2021   CO2 27 10/02/2021   BUN 22 10/02/2021   CREATININE 0.76 10/02/2021   GLUCOSE 89 10/02/2021   ALT 13 10/02/2021    Lab Results  Component Value Date   CHOL 218 (H) 10/02/2021   HDL 67.60 10/02/2021   LDLCALC 135 (H) 10/02/2021   LDLDIRECT 143.0 08/10/2012   TRIG 78.0 10/02/2021   CHOLHDL 3 10/02/2021     --------------------------------------------------------------------------------------------------  ASSESSMENT AND PLAN: Near-syncope, PACs, PVCs, and abnormal EKG: Kelsey Haley reports a long history of lightheadedness as well as intermittent vertigo.  It is certainly possible that her episodes are vasovagal in nature.  Recent echo showed mild aortic and mitral regurgitation but no other significant structural abnormalities.  She does not exhibit an orthostatic blood pressure/heart rate response today, though she notes that her home blood pressure is typically  quite a bit lower than what we measured in the office (she attributes this to whitecoat hypertension).  I have counseled her to stay well-hydrated and to minimize her caffeine consumption.  Liberalizing salt intake is also reasonable.  We discussed the role for use of compressive garments to help support venous return.  Given ectopy noted today, nonspecific ST segment changes, and recurrent near syncope, we have agreed to place a 14-day event monitor as well as obtain an exercise myocardial perfusion stress test to exclude underlying ischemic heart disease.  Hyperlipidemia: LDL mildly elevated on last check in May.  It is reasonable to continue with lifestyle modifications pending aforementioned stress test.  If there is evidence of ASCVD, statin therapy will need to be considered.  Follow-up: Return to clinic in early 01/2022.  Nelva Bush, MD 11/27/2021 10:01 AM

## 2021-11-27 ENCOUNTER — Ambulatory Visit (INDEPENDENT_AMBULATORY_CARE_PROVIDER_SITE_OTHER): Payer: PPO

## 2021-11-27 ENCOUNTER — Ambulatory Visit: Payer: PPO | Attending: Internal Medicine | Admitting: Internal Medicine

## 2021-11-27 ENCOUNTER — Encounter: Payer: Self-pay | Admitting: Internal Medicine

## 2021-11-27 VITALS — BP 137/81 | HR 70 | Ht 62.0 in | Wt 148.0 lb

## 2021-11-27 DIAGNOSIS — R9431 Abnormal electrocardiogram [ECG] [EKG]: Secondary | ICD-10-CM

## 2021-11-27 DIAGNOSIS — I493 Ventricular premature depolarization: Secondary | ICD-10-CM

## 2021-11-27 DIAGNOSIS — R55 Syncope and collapse: Secondary | ICD-10-CM

## 2021-11-27 DIAGNOSIS — E7800 Pure hypercholesterolemia, unspecified: Secondary | ICD-10-CM | POA: Diagnosis not present

## 2021-11-27 DIAGNOSIS — I491 Atrial premature depolarization: Secondary | ICD-10-CM | POA: Diagnosis not present

## 2021-11-27 NOTE — Patient Instructions (Signed)
Medication Instructions:   Your physician recommends that you continue on your current medications as directed. Please refer to the Current Medication list given to you today.  *If you need a refill on your cardiac medications before your next appointment, please call your pharmacy*   Testing/Procedures:   [place]   Your caregiver has ordered a Stress Test with nuclear imaging. The purpose of this test is to evaluate the blood supply to your heart muscle. This procedure is referred to as a "Non-Invasive Stress Test." This is because other than having an IV started in your vein, nothing is inserted or "invades" your body. Cardiac stress tests are done to find areas of poor blood flow to the heart by determining the extent of coronary artery disease (CAD). Some patients exercise on a treadmill, which naturally increases the blood flow to your heart, while others who are  unable to walk on a treadmill due to physical limitations have a pharmacologic/chemical stress agent called Lexiscan . This medicine will mimic walking on a treadmill by temporarily increasing your coronary blood flow.      PLEASE REPORT TO [facility] MEDICAL MALL ENTRANCE   THE VOLUNTEERS AT THE FIRST DESK WILL DIRECT YOU WHERE TO GO     *Please note: these test may take anywhere between 2-4 hours to complete       Date of Procedure:_____________________________________   Arrival Time for Procedure:______________________________    PLEASE NOTIFY THE OFFICE AT LEAST 24 HOURS IN ADVANCE IF YOU ARE UNABLE TO KEEP YOUR APPOINTMENT.  [place] 24 HOURS IN ADVANCE IF YOU ARE UNABLE TO KEEP YOUR APPOINTMENT. [phone]    How to prepare for your Myoview test:   1. Do not eat or drink after midnight  2. No caffeine for 24 hours prior to test  3. No smoking 24 hours prior to test.  4. Unless instructed otherwise, Take your medication with a small sips of  water.    5.         Ladies, please do not wear dresses. Skirts or pants are appropriate. Please wear a short sleeve shirt.  6. No perfume, cologne or lotion.  7. Wear comfortable walking shoes. No heels!    2.  Your physician has recommended that you wear a Zio XT monitor for 2 weeks. This will be mailed to your home address in 4-5 business days.   Your clinician has requested a Zio heart rhythm monitor by [employer] to be mailed to your home for you to wear for 14 days. You should expect a small box to arrive via USPS (or FedEx in some cases) within this next week. If you do not receive it please call [employer] at [phone].  Closely watching your heart at this time will help your care team understand more and provide information needed to develop your plan of care.  Please apply your Zio patch monitor the day you receive it. Keep this packaging, you will use this to return your Zio monitor.  You will easily be able to apply the monitor with the instructions provided in the Patient Guide.  If you need assistance, [employer] representatives are available 24/7 at [phone].  You can also download the [facility] app on your phone to view detailed application instructions and log symptoms.  After you wear your monitor for 14 days, place it back in the blue box or envelope, along with your Symptom Log.  To send your monitor  back: Simply use the pre-addressed and pre-paid box/envelope.  Send it back through [employer] the same day you remove it via your local post office or by placing it in your mailbox.  As soon as we receive the results, they will be reviewed and your clinician will contact you.  For the first 24 hours- it is essential to not shower or exercise, to allow the patch to adhere to your skin. Avoid excessive sweating to help maximize wear time. Do not submerge the device, no hot tubs, and no swimming pools. Keep any lotions or oils away from the patch. After 24 hours you may  shower with the patch on. Take brief showers with your back facing the shower head.  Do not remove patch once it has been placed because that will interrupt data and decrease adhesive wear time. Push the button when you have any symptoms and write down what you were feeling. Once you have completed wearing your monitor, remove and place into box which has postage paid and place in your outgoing mailbox.  If for some reason you have misplaced your box then call our office and we can provide another box and/or mail it off for you.     Follow-Up: At [facility], you and your health needs are our priority.  As part of our continuing mission to provide you with exceptional heart care, we have created designated Provider Care Teams.  These Care Teams include your primary Cardiologist (physician) and Advanced Practice Providers (APPs -  Physician Assistants and Nurse Practitioners) who all work together to provide you with the care you need, when you need it.  We recommend signing up for the patient portal called "MyChart".  Sign up information is provided on this After Visit Summary.  MyChart is used to connect with patients for Virtual Visits (Telemedicine).  Patients are able to view lab/test results, encounter notes, upcoming appointments, etc.  Non-urgent messages can be sent to your provider as well.   To learn more about what you can do with MyChart, go to [email or web address].    Your next appointment:    Early November   The format for your next appointment:   In Person  Provider:   You may see Nelva Bush, MD or one of the following Advanced Practice Providers on your designated Care Team:   Murray Hodgkins, NP Christell Faith, PA-C Cadence Kathlen Mody, PA-C Gerrie Nordmann, NP      Important Information About Sugar

## 2021-11-28 ENCOUNTER — Ambulatory Visit: Admission: RE | Admit: 2021-11-28 | Payer: PPO | Source: Ambulatory Visit

## 2021-11-29 ENCOUNTER — Encounter
Admission: RE | Admit: 2021-11-29 | Discharge: 2021-11-29 | Disposition: A | Discharge status: Home / Self Care | Payer: PPO | Source: Ambulatory Visit | Attending: Internal Medicine | Admitting: Internal Medicine

## 2021-11-29 DIAGNOSIS — R55 Syncope and collapse: Secondary | ICD-10-CM | POA: Diagnosis not present

## 2021-11-29 LAB — NM MYOCAR MULTI W/SPECT W/WALL MOTION / EF
Angina Index: 0
Duke Treadmill Score: 5
Estimated workload: 6.4
Exercise duration (min): 4 min
Exercise duration (sec): 33 s
LV dias vol: 120 mL (ref 46–106)
LV sys vol: 58 mL
MPHR: 145 {beats}/min
Nuc Stress EF: 52 %
Peak HR: 164 {beats}/min
Percent HR: 113 %
Rest HR: 73 {beats}/min
Rest Nuclear Isotope Dose: 10.2 mCi
SDS: 5
SRS: 4
SSS: 9
ST Depression (mm): 0 mm
Stress Nuclear Isotope Dose: 31.2 mCi
TID: 0.94

## 2021-11-29 MED ORDER — TECHNETIUM TC 99M TETROFOSMIN IV KIT
31.1800 | PACK | Freq: Once | INTRAVENOUS | Status: AC | PRN
  Administered 2021-11-29: 31.18 via INTRAVENOUS

## 2021-11-29 MED ORDER — TECHNETIUM TC 99M TETROFOSMIN IV KIT
10.1600 | PACK | Freq: Once | INTRAVENOUS | Status: AC | PRN
  Administered 2021-11-29: 10.16 via INTRAVENOUS

## 2021-11-29 MED ORDER — REGADENOSON 0.4 MG/5ML IV SOLN
0.4000 mg | Freq: Once | INTRAVENOUS | Status: AC
  Administered 2021-11-29: 0.4 mg via INTRAVENOUS
  Filled 2021-11-29: qty 5

## 2021-12-01 DIAGNOSIS — R55 Syncope and collapse: Secondary | ICD-10-CM | POA: Diagnosis not present

## 2021-12-02 ENCOUNTER — Telehealth: Payer: Self-pay | Admitting: Internal Medicine

## 2021-12-02 NOTE — Telephone Encounter (Signed)
Attempted to call patient to discuss results of Friday's stress test but did not get an answer.  Stress test was low risk with reportedly normal perfusion and nonspecific ST changes during stress.  On my review of the images, there may be subtle basal and mid inferolateral ischemia versus artifact.  Overall, study remains low risk.  I recommend continued medical therapy and follow-up after completion of event monitor.  Nelva Bush, MD [facility]

## 2021-12-02 NOTE — Telephone Encounter (Signed)
Patient and her daughter made aware of results and Dr. Darnelle Bos recommendation with verbalized understanding. Patient is currently wearing the zio monitor. Patient ask that once the zio results are available that they be sent through mychart.

## 2021-12-07 ENCOUNTER — Other Ambulatory Visit: Payer: Self-pay | Admitting: Family Medicine

## 2021-12-18 DIAGNOSIS — R55 Syncope and collapse: Secondary | ICD-10-CM | POA: Diagnosis not present

## 2022-01-15 ENCOUNTER — Ambulatory Visit
Admission: RE | Admit: 2022-01-15 | Discharge: 2022-01-15 | Disposition: A | Discharge status: Home / Self Care | Payer: PPO | Source: Ambulatory Visit | Attending: Family Medicine | Admitting: Family Medicine

## 2022-01-15 DIAGNOSIS — E2839 Other primary ovarian failure: Secondary | ICD-10-CM

## 2022-01-15 DIAGNOSIS — Z780 Asymptomatic menopausal state: Secondary | ICD-10-CM | POA: Diagnosis not present

## 2022-01-15 DIAGNOSIS — Z1231 Encounter for screening mammogram for malignant neoplasm of breast: Secondary | ICD-10-CM | POA: Diagnosis not present

## 2022-01-15 DIAGNOSIS — M8588 Other specified disorders of bone density and structure, other site: Secondary | ICD-10-CM | POA: Diagnosis not present

## 2022-01-31 ENCOUNTER — Ambulatory Visit: Payer: PPO | Attending: Internal Medicine | Admitting: Internal Medicine

## 2022-01-31 ENCOUNTER — Encounter: Payer: Self-pay | Admitting: Internal Medicine

## 2022-01-31 VITALS — BP 136/74 | HR 71 | Ht 62.0 in | Wt 151.0 lb

## 2022-01-31 DIAGNOSIS — Z01810 Encounter for preprocedural cardiovascular examination: Secondary | ICD-10-CM | POA: Diagnosis not present

## 2022-01-31 DIAGNOSIS — I080 Rheumatic disorders of both mitral and aortic valves: Secondary | ICD-10-CM | POA: Insufficient documentation

## 2022-01-31 DIAGNOSIS — R55 Syncope and collapse: Secondary | ICD-10-CM

## 2022-01-31 DIAGNOSIS — I493 Ventricular premature depolarization: Secondary | ICD-10-CM

## 2022-01-31 DIAGNOSIS — I491 Atrial premature depolarization: Secondary | ICD-10-CM | POA: Diagnosis not present

## 2022-01-31 NOTE — Patient Instructions (Signed)
Medication Instructions:  Your Physician recommend you continue on your current medication as directed.    *If you need a refill on your cardiac medications before your next appointment, please call your pharmacy*   Lab Work: None ordered today   Testing/Procedures: None ordered today   Follow-Up: At [facility], you and your health needs are our priority.  As part of our continuing mission to provide you with exceptional heart care, we have created designated Provider Care Teams.  These Care Teams include your primary Cardiologist (physician) and Advanced Practice Providers (APPs -  Physician Assistants and Nurse Practitioners) who all work together to provide you with the care you need, when you need it.  We recommend signing up for the patient portal called "MyChart".  Sign up information is provided on this After Visit Summary.  MyChart is used to connect with patients for Virtual Visits (Telemedicine).  Patients are able to view lab/test results, encounter notes, upcoming appointments, etc.  Non-urgent messages can be sent to your provider as well.   To learn more about what you can do with MyChart, go to [email or web address].    Your next appointment:   1 year(s)  The format for your next appointment:   In Person  Provider:   Nelva Bush, MD

## 2022-01-31 NOTE — Progress Notes (Signed)
Follow-up Outpatient Visit Date: 01/31/2022  Primary Care Provider: Abner Greenspan, MD [place] [address]  Chief Complaint: Follow-up near-syncope  HPI:  [name] is a 76 y.o. female with history of recurrent lightheadedness/near-syncope, hyperlipidemia, intermittent vertigo, migraine headaches, and back lipoma, who presents for follow-up of near-syncope.  I met her in September for evaluation of a near-syncopal episode that occurred while she was vacationing in [place].  She had been feeling weak with a poor appetite.  She admits to not having had as much to eat or drink as usual.  She sat down at a picnic table and asked her husband to get her something to drink.  As she felt lightheaded, she leaned her head on the table.  She then sat up and almost completely blacked out, falling backwards and hitting her head on the ground.  She did not pass out completely.  She has had similar episodes in the past and notes that she gets significant lightheadedness every few weeks.  Hospital course was unremarkable, with cardiac biomarkers being negative.  Echocardiogram showed normal LVEF with grade 1 diastolic dysfunction and mild aortic and mitral regurgitation.  Her near syncopal episode was felt to be vasovagal in nature.  She reports also having had significant vertigo in the past though this recent episode was more consistent with lightheadedness than vertigo.  She was feeling fairly well when we met and agreed to obtain a Myoview and event monitor.  The exercise Myoview showed transient ST changes during stress but no evidence of ischemia or scar on the perfusion images.  There was also no coronary artery calcification.  Event monitor showed frequent PAC's and occasional PVC's but no sustained arrhythmia to explain her near-syncope.  Today, Kelsey Haley reports that she has been feeling well.  She return from [place] late last month.  She feels like her energy is better than at our  last visit.  She has not had any further lightheadedness or near-syncope.  She denies chest pain, shortness of breath, and palpitations as well.  She is planning to undergo cataract surgery and brings a clearance form with her today.  --------------------------------------------------------------------------------------------------  Past Medical History:  Diagnosis Date   Encounter for Medicare annual wellness exam 05/16/2015   Estrogen deficiency 05/16/2015   Family history of uterine cancer 04/20/2017   Headache    occasional migranes   Hearing loss    Hearing loss 11/22/2008   Qualifier: Diagnosis of  By: Glori Bickers MD, [place] 01/08/2010   Qualifier: Diagnosis of  By: Joesph Fillers CMA (AAMA), Natasha     INTERMITTENT VERTIGO 11/22/2008   Qualifier: Diagnosis of  By: Glori Bickers MD, Carmell Austria    Lipoma 12/20/2014   On L back    Osteopenia    Rash and nonspecific skin eruption 05/16/2015   Atopic? -occ neck or L hand  Uses triamcinolone    Screening mammogram, encounter for 11/12/2016   Stress headaches 05/16/2015   Vertigo    Vitamin D deficiency 01/08/2010   Qualifier: Diagnosis of  By: Joesph Fillers CMA (AAMA), Ronny Bacon     Past Surgical History:  Procedure Laterality Date   COLONOSCOPY WITH PROPOFOL N/A 08/20/2015   Procedure: COLONOSCOPY WITH PROPOFOL;  Surgeon: Manya Silvas, MD;  Location: [facility] ENDOSCOPY;  Service: Endoscopy;  Laterality: N/A;   left knee surgery     TONSILLECTOMY      Current Meds  Medication Sig   Ascorbic Acid (VITAMIN C) 1000  MG tablet Take 1,000 mg by mouth daily.   b complex vitamins capsule Take 1 capsule by mouth daily.   Calcium-Magnesium-Zinc 500-250-12.5 MG TABS Take 2 tablets by mouth daily.   Cholecalciferol (VITAMIN D-3) 5000 units TABS Take 1 tablet by mouth daily.   Potassium Gluconate 550 MG TABS Take 1 tablet by mouth daily.    Allergies: Patient has no known allergies.  Social History   Tobacco Use   Smoking status: Former    Types:  Cigarettes    Quit date: 03/17/1994    Years since quitting: 27.8   Smokeless tobacco: Never   Tobacco comments:    was a light smoker before quitting  Vaping Use   Vaping Use: Never used  Substance Use Topics   Alcohol use: Yes    Alcohol/week: 1.0 standard drink of alcohol    Types: 1 Glasses of wine per week    Comment: occasional   Drug use: No    Family History  Problem Relation Age of Onset   Hypotension Mother    Hypertension Father    Prostate cancer Father    Uterine cancer Sister    Prostate cancer Brother    Breast cancer Neg Hx     Review of Systems: A 12-system review of systems was performed and was negative except as noted in the HPI.  --------------------------------------------------------------------------------------------------  Physical Exam: BP 136/74 (BP Location: Left Arm, Patient Position: Sitting, Cuff Size: Normal)   Pulse 71   Ht _0  (1.575 m)   Wt 151 lb (68.5 kg)   SpO2 96%   BMI 27.62 kg/m   General:  NAD. Neck: No JVD or HJR. Lungs: Clear to auscultation bilaterally without wheezes or crackles. Heart: Regular rate and rhythm without murmurs, rubs, or gallops. Abdomen: Soft, nontender, nondistended. Extremities: No lower extremity edema.  Lab Results  Component Value Date   WBC 5.0 10/02/2021   HGB 13.5 10/02/2021   HCT 40.6 10/02/2021   MCV 95.0 10/02/2021   PLT 256.0 10/02/2021    Lab Results  Component Value Date   NA 139 10/02/2021   K 4.2 10/02/2021   CL 104 10/02/2021   CO2 27 10/02/2021   BUN 22 10/02/2021   CREATININE 0.76 10/02/2021   GLUCOSE 89 10/02/2021   ALT 13 10/02/2021    Lab Results  Component Value Date   CHOL 218 (H) 10/02/2021   HDL 67.60 10/02/2021   LDLCALC 135 (H) 10/02/2021   LDLDIRECT 143.0 08/10/2012   TRIG 78.0 10/02/2021   CHOLHDL 3 10/02/2021    --------------------------------------------------------------------------------------------------  ASSESSMENT AND PLAN: Near-syncope,  PAC's, and PVC's: No further episodes reported.  I suspect dehydration may have contributed to her episode in [place] earlier this year.  Workup has been unremarkable other than frequent PAC's and occasional PVC's.  As outside echo did not show any significant structural abnormalities and recent Myoview was without ischemia or scar, no further testing is recommended.  We will defer pharmacotherapy, as Ms. Muhlenkamp is asymptomatic and may develop symptomatic bradycardia with addition of a beta-blocker given an average HR of 62 bpm during the recent monitoring period.  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  No symptoms reported.  Consider follow-up echocardiogram in 3-5 years (sooner if symptoms of heart failure develop).  Preop: Ms. Saltz does not have any unstable cardiac symptoms.  Recent evaluation of near-syncope was reassuring.  It is reasonable for her to proceed with cataract surgery, which is low  risk from a cardiovascular standpoint, without additional cardiac testing or intervention.  Follow-up: Return to clinic in 1 year.  Nelva Bush, MD 01/31/2022 4:05 PM

## 2022-02-18 ENCOUNTER — Encounter
Admission: RE | Disposition: A | Discharge status: Home / Self Care | Payer: Self-pay | Source: Home / Self Care | Attending: Gastroenterology

## 2022-02-18 ENCOUNTER — Ambulatory Visit
Admission: RE | Admit: 2022-02-18 | Discharge: 2022-02-18 | Disposition: A | Discharge status: Home / Self Care | Payer: PPO | Attending: Gastroenterology | Admitting: Gastroenterology

## 2022-02-18 ENCOUNTER — Ambulatory Visit: Payer: PPO | Admitting: Certified Registered"

## 2022-02-18 ENCOUNTER — Encounter: Payer: Self-pay | Admitting: Gastroenterology

## 2022-02-18 DIAGNOSIS — K5730 Diverticulosis of large intestine without perforation or abscess without bleeding: Secondary | ICD-10-CM | POA: Insufficient documentation

## 2022-02-18 DIAGNOSIS — Z1211 Encounter for screening for malignant neoplasm of colon: Secondary | ICD-10-CM | POA: Diagnosis not present

## 2022-02-18 DIAGNOSIS — D123 Benign neoplasm of transverse colon: Secondary | ICD-10-CM | POA: Insufficient documentation

## 2022-02-18 DIAGNOSIS — D120 Benign neoplasm of cecum: Secondary | ICD-10-CM | POA: Insufficient documentation

## 2022-02-18 DIAGNOSIS — Z87891 Personal history of nicotine dependence: Secondary | ICD-10-CM | POA: Insufficient documentation

## 2022-02-18 DIAGNOSIS — Z860100 Personal history of colon polyps, unspecified: Secondary | ICD-10-CM

## 2022-02-18 DIAGNOSIS — D122 Benign neoplasm of ascending colon: Secondary | ICD-10-CM | POA: Diagnosis not present

## 2022-02-18 DIAGNOSIS — D126 Benign neoplasm of colon, unspecified: Secondary | ICD-10-CM | POA: Diagnosis not present

## 2022-02-18 DIAGNOSIS — K635 Polyp of colon: Secondary | ICD-10-CM | POA: Diagnosis not present

## 2022-02-18 HISTORY — PX: COLONOSCOPY WITH PROPOFOL: SHX5780

## 2022-02-18 SURGERY — COLONOSCOPY WITH PROPOFOL
Anesthesia: General

## 2022-02-18 MED ORDER — LIDOCAINE HCL (CARDIAC) PF 100 MG/5ML IV SOSY
PREFILLED_SYRINGE | INTRAVENOUS | Status: DC | PRN
  Administered 2022-02-18: 50 mg via INTRAVENOUS

## 2022-02-18 MED ORDER — SODIUM CHLORIDE 0.9 % IV SOLN: INTRAVENOUS | Status: DC

## 2022-02-18 MED ORDER — PROPOFOL 10 MG/ML IV BOLUS
INTRAVENOUS | Status: DC | PRN
  Administered 2022-02-18: 60 mg via INTRAVENOUS

## 2022-02-18 MED ORDER — PROPOFOL 500 MG/50ML IV EMUL
INTRAVENOUS | Status: DC | PRN
  Administered 2022-02-18: 150 ug/kg/min via INTRAVENOUS

## 2022-02-18 MED ORDER — GLYCOPYRROLATE 0.2 MG/ML IJ SOLN
INTRAMUSCULAR | Status: DC | PRN
  Administered 2022-02-18: .2 mg via INTRAVENOUS

## 2022-02-18 NOTE — Anesthesia Procedure Notes (Signed)
Procedure Name: MAC Date/Time: 02/18/2022 10:08 AM  Performed by: Jerrye Noble, CRNAPre-anesthesia Checklist: Patient identified, Emergency Drugs available, Suction available and Patient being monitored Patient Re-evaluated:Patient Re-evaluated prior to induction Oxygen Delivery Method: Nasal cannula

## 2022-02-18 NOTE — Anesthesia Preprocedure Evaluation (Signed)
Anesthesia Evaluation  Patient identified by MRN, date of birth, ID band Patient awake    Reviewed: Allergy & Precautions, H&P , NPO status , Patient's Chart, lab work & pertinent test results, reviewed documented beta blocker date and time   History of Anesthesia Complications Negative for: history of anesthetic complications  Airway Mallampati: II   Neck ROM: full    Dental  (+) Poor Dentition, Teeth Intact, Dental Advidsory Given   Pulmonary neg pulmonary ROS, former smoker   Pulmonary exam normal        Cardiovascular negative cardio ROS Normal cardiovascular exam Rhythm:regular Rate:Normal     Neuro/Psych  Headaches negative neurological ROS  negative psych ROS   GI/Hepatic negative GI ROS, Neg liver ROS,,,  Endo/Other  negative endocrine ROS    Renal/GU negative Renal ROS  negative genitourinary   Musculoskeletal   Abdominal   Peds  Hematology negative hematology ROS (+)   Anesthesia Other Findings Past Medical History:   Vertigo                                                      Hearing loss                                                 Osteopenia                                                   Headache                                                       Comment:occasional migranes Past Surgical History:   left knee surgery                                             TONSILLECTOMY                                               BMI    Body Mass Index   26.51 kg/m 2     Reproductive/Obstetrics                             Anesthesia Physical Anesthesia Plan  ASA: 2  Anesthesia Plan: General   Post-op Pain Management:    Induction: Intravenous  PONV Risk Score and Plan: Propofol infusion and TIVA  Airway Management Planned: Natural Airway and Nasal Cannula  Additional Equipment:   Intra-op Plan:   Post-operative Plan:   Informed Consent: I have reviewed  the patients History and Physical, chart, labs and discussed the procedure including the risks, benefits and alternatives  for the proposed anesthesia with the patient or authorized representative who has indicated his/her understanding and acceptance.     Dental Advisory Given  Plan Discussed with: CRNA  Anesthesia Plan Comments:         Anesthesia Quick Evaluation

## 2022-02-18 NOTE — H&P (Signed)
Cephas Darby, MD [address]  [place]  [place], [place] [address]  Main: [phone]  Fax: [phone] Pager: [phone]  Primary Care Physician:  Tower, Wynelle Fanny, MD Primary Gastroenterologist:  Dr. Cephas Darby  Pre-Procedure History & Physical: HPI:  Kelsey Haley is a 76 y.o. female is here for an colonoscopy.   Past Medical History:  Diagnosis Date   Encounter for Medicare annual wellness exam 05/16/2015   Estrogen deficiency 05/16/2015   Family history of uterine cancer 04/20/2017   Headache    occasional migranes   Hearing loss    Hearing loss 11/22/2008   Qualifier: Diagnosis of  By: Glori Bickers MD, [place] 01/08/2010   Qualifier: Diagnosis of  By: Joesph Fillers CMA (AAMA), Natasha     INTERMITTENT VERTIGO 11/22/2008   Qualifier: Diagnosis of  By: Glori Bickers MD, Carmell Austria    Lipoma 12/20/2014   On L back    Osteopenia    Rash and nonspecific skin eruption 05/16/2015   Atopic? -occ neck or L hand  Uses triamcinolone    Screening mammogram, encounter for 11/12/2016   Stress headaches 05/16/2015   Vertigo    Vitamin D deficiency 01/08/2010   Qualifier: Diagnosis of  By: Joesph Fillers CMA (AAMA), Ronny Bacon      Past Surgical History:  Procedure Laterality Date   COLONOSCOPY WITH PROPOFOL N/A 08/20/2015   Procedure: COLONOSCOPY WITH PROPOFOL;  Surgeon: Manya Silvas, MD;  Location: [facility] ENDOSCOPY;  Service: Endoscopy;  Laterality: N/A;   left knee surgery     TONSILLECTOMY      Prior to Admission medications   Medication Sig Start Date End Date Taking? Authorizing Provider  Ascorbic Acid (VITAMIN C) 1000 MG tablet Take 1,000 mg by mouth daily.   Yes [provider]  b complex vitamins capsule Take 1 capsule by mouth daily.   Yes [provider]  Calcium-Magnesium-Zinc 500-250-12.5 MG TABS Take 2 tablets by mouth daily.   Yes [provider]  Cholecalciferol (VITAMIN D-3) 5000 units TABS Take 1 tablet by mouth daily.   Yes [provider]  Potassium Gluconate 550 MG TABS Take 1 tablet by mouth daily.   Yes [provider]    Allergies as of 10/16/2021   (No Known Allergies)    Family History  Problem Relation Age of Onset   Hypotension Mother    Hypertension Father    Prostate cancer Father    Uterine cancer Sister    Prostate cancer Brother    Breast cancer Neg Hx     Social History   Socioeconomic History   Marital status: Married    Spouse name: Not on file   Number of children: Not on file   Years of education: Not on file   Highest education level: Not on file  Occupational History   Not on file  Tobacco Use   Smoking status: Former    Types: Cigarettes    Quit date: 03/17/1994    Years since quitting: 27.9   Smokeless tobacco: Never   Tobacco comments:    was a light smoker before quitting  Vaping Use   Vaping Use: Never used  Substance and Sexual Activity   Alcohol use: Not Currently    Alcohol/week: 1.0 standard drink of alcohol    Types: 1 Glasses of wine per week    Comment: occasional   Drug use: No   Sexual activity: Not on file  Other Topics Concern  Not on file  Social History Narrative   Not on file   Social Determinants of Health   Financial Resource Strain: Low Risk  (11/20/2021)   Overall Financial Resource Strain (CARDIA)    Difficulty of Paying Living Expenses: Not very hard  Food Insecurity: No Food Insecurity (11/20/2021)   Hunger Vital Sign    Worried About Running Out of Food in the Last Year: Never true    Ran Out of Food in the Last Year: Never true  Transportation Needs: No Transportation Needs (11/20/2021)   PRAPARE - Hydrologist (Medical): No    Lack of Transportation (Non-Medical): No  Physical Activity: Insufficiently Active (11/20/2021)   Exercise Vital Sign    Days of Exercise per Week: 3 days    Minutes of Exercise per Session: 30 min  Stress: No Stress Concern Present (11/20/2021)   [place]    Feeling of Stress : Not at all  Social Connections: Moderately Isolated (11/20/2021)   Social Connection and Isolation Panel [NHANES]    Frequency of Communication with Friends and Family: More than three times a week    Frequency of Social Gatherings with Friends and Family: More than three times a week    Attends Religious Services: Never    Marine scientist or Organizations: No    Attends Archivist Meetings: Never    Marital Status: Married  Human resources officer Violence: Not At Risk (11/20/2021)   Humiliation, Afraid, Rape, and Kick questionnaire    Fear of Current or Ex-Partner: No    Emotionally Abused: No    Physically Abused: No    Sexually Abused: No    Review of Systems: See HPI, otherwise negative ROS  Physical Exam: BP 127/74   Pulse 85   Temp (!) 96.2 F (35.7 C) (Temporal)   Resp 16   Ht '5\' 2"'$  (1.575 m)   Wt 67.3 kg   SpO2 100%   BMI 27.13 kg/m  General:   Alert,  pleasant and cooperative in NAD Head:  Normocephalic and atraumatic. Neck:  Supple; no masses or thyromegaly. Lungs:  Clear throughout to auscultation.    Heart:  Regular rate and rhythm. Abdomen:  Soft, nontender and nondistended. Normal bowel sounds, without guarding, and without rebound.   Neurologic:  Alert and  oriented x4;  grossly normal neurologically.  Impression/Plan: Kelsey Haley is here for an colonoscopy to be performed for h/o adenomas colon  Risks, benefits, limitations, and alternatives regarding  colonoscopy have been reviewed with the patient.  Questions have been answered.  All parties agreeable.   Sherri Sear, MD  02/18/2022, 9:40 AM

## 2022-02-18 NOTE — Transfer of Care (Signed)
Immediate Anesthesia Transfer of Care Note  Patient: Kelsey Haley  Procedure(s) Performed: COLONOSCOPY WITH PROPOFOL  Patient Location: PACU and Endoscopy Unit  Anesthesia Type:General  Level of Consciousness: drowsy  Airway & Oxygen Therapy: Patient Spontanous Breathing  Post-op Assessment: Report given to RN and Post -op Vital signs reviewed and stable  Post vital signs: Reviewed and stable  Last Vitals:  Vitals Value Taken Time  BP 102/74 02/18/22 1047  Temp 36.1 C 02/18/22 1047  Pulse 88 02/18/22 1048  Resp 15 02/18/22 1048  SpO2 99 % 02/18/22 1048  Vitals shown include unvalidated device data.  Last Pain:  Vitals:   02/18/22 1047  TempSrc: Temporal  PainSc: Asleep         Complications: No notable events documented.

## 2022-02-18 NOTE — Op Note (Signed)
[facility] Gastroenterology Patient Name: Kelsey Haley Procedure Date: 02/18/2022 9:59 AM MRN: [id number] Account #: [id number] Date of Birth: 04-09-1945 Admit Type: Outpatient Age: 76 Room: [facility] ENDO ROOM 3 Gender: Female Note Status: Finalized Instrument Name: Jasper Riling [id number] Procedure:             Colonoscopy Indications:           Surveillance: Personal history of adenomatous polyps                         on last colonoscopy > 5 years ago, Last colonoscopy:                         June 2017 Providers:             Lin Landsman MD, MD Referring MD:          Lin Landsman MD, MD (Referring MD), Wynelle Fanny.                         Tower (Referring MD) Medicines:             General Anesthesia Complications:         No immediate complications. Estimated blood loss: None. Procedure:             Pre-Anesthesia Assessment:                        - Prior to the procedure, a History and Physical was                         performed, and patient medications and allergies were                         reviewed. The patient is competent. The risks and                         benefits of the procedure and the sedation options and                         risks were discussed with the patient. All questions                         were answered and informed consent was obtained.                         Patient identification and proposed procedure were                         verified by the physician, the nurse, the                         anesthesiologist, the anesthetist and the technician                         in the pre-procedure area in the procedure room in the                         endoscopy suite. Mental Status Examination: alert and  oriented. Airway Examination: normal oropharyngeal                         airway and neck mobility. Respiratory Examination:                         clear to auscultation. CV Examination: normal.                          Prophylactic Antibiotics: The patient does not require                         prophylactic antibiotics. Prior Anticoagulants: The                         patient has taken no anticoagulant or antiplatelet                         agents. ASA Grade Assessment: II - A patient with mild                         systemic disease. After reviewing the risks and                         benefits, the patient was deemed in satisfactory                         condition to undergo the procedure. The anesthesia                         plan was to use general anesthesia. Immediately prior                         to administration of medications, the patient was                         re-assessed for adequacy to receive sedatives. The                         heart rate, respiratory rate, oxygen saturations,                         blood pressure, adequacy of pulmonary ventilation, and                         response to care were monitored throughout the                         procedure. The physical status of the patient was                         re-assessed after the procedure.                        After obtaining informed consent, the colonoscope was                         passed under direct vision. Throughout the procedure,  the patient's blood pressure, pulse, and oxygen                         saturations were monitored continuously. The                         Colonoscope was introduced through the anus and                         advanced to the the terminal ileum, with                         identification of the appendiceal orifice and IC                         valve. The colonoscopy was extremely difficult due to                         significant looping. Successful completion of the                         procedure was aided by applying abdominal pressure.                         The patient tolerated the procedure well. The quality                          of the bowel preparation was evaluated using the BBPS                         [facility] Bowel Preparation Scale) with scores of: Right                         Colon = 3, Transverse Colon = 3 and Left Colon = 3                         (entire mucosa seen well with no residual staining,                         small fragments of stool or opaque liquid). The total                         BBPS score equals 9. The terminal ileum, ileocecal                         valve, appendiceal orifice, and rectum were                         photographed. Findings:      The perianal and digital rectal examinations were normal. Pertinent       negatives include normal sphincter tone and no palpable rectal lesions.      The terminal ileum appeared normal.      Two sessile polyps were found in the cecum. The polyps were 4 to 5 mm in       size. These polyps were removed with a cold snare. Resection and       retrieval were complete.  A diminutive polyp was found in the cecum. The polyp was sessile. The       polyp was removed with a cold biopsy forceps. Resection and retrieval       were complete. Estimated blood loss: none.      Three sessile polyps were found in the transverse colon and ascending       colon. The polyps were 4 to 5 mm in size. These polyps were removed with       a cold snare. Resection and retrieval were complete. Estimated blood       loss: none.      Many small-mouthed diverticula were found in the sigmoid colon.      The retroflexed view of the distal rectum and anal verge was normal and       showed no anal or rectal abnormalities. Impression:            - The examined portion of the ileum was normal.                        - Two 4 to 5 mm polyps in the cecum, removed with a                         cold snare. Resected and retrieved.                        - One diminutive polyp in the cecum, removed with a                         cold biopsy forceps.  Resected and retrieved.                        - Three 4 to 5 mm polyps in the transverse colon and                         in the ascending colon, removed with a cold snare.                         Resected and retrieved.                        - Diverticulosis in the sigmoid colon.                        - The distal rectum and anal verge are normal on                         retroflexion view. Recommendation:        - Discharge patient to home (with escort).                        - Resume previous diet today.                        - Continue present medications.                        - Await pathology results.                        -  Repeat colonoscopy in 3 - 5 years for surveillance                         of multiple polyps. Procedure Code(s):     --- Professional ---                        [phone], Colonoscopy, flexible; with removal of                         tumor(s), polyp(s), or other lesion(s) by snare                         technique                        [id number], 71, Colonoscopy, flexible; with biopsy, single                         or multiple Diagnosis Code(s):     --- Professional ---                        Z86.010, Personal history of colonic polyps                        D12.0, Benign neoplasm of cecum                        D12.3, Benign neoplasm of transverse colon (hepatic                         flexure or splenic flexure)                        K57.30, Diverticulosis of large intestine without                         perforation or abscess without bleeding                        D12.2, Benign neoplasm of ascending colon CPT copyright 2022 American [employer] Association. All rights reserved. The codes documented in this report are preliminary and upon coder review may  be revised to meet current compliance requirements. Dr. Ulyess Mort Lin Landsman MD, MD 02/18/2022 10:46:33 AM This report has been signed electronically. Number of Addenda: 0 Note  Initiated On: 02/18/2022 9:59 AM Scope Withdrawal Time: 0 hours 19 minutes 34 seconds  Total Procedure Duration: 0 hours 34 minutes 43 seconds  Estimated Blood Loss:  Estimated blood loss: none.      [facility]

## 2022-02-19 ENCOUNTER — Encounter: Payer: Self-pay | Admitting: Gastroenterology

## 2022-02-19 LAB — SURGICAL PATHOLOGY

## 2022-02-20 NOTE — Anesthesia Postprocedure Evaluation (Signed)
Anesthesia Post Note  Patient: Kelsey Haley  Procedure(s) Performed: COLONOSCOPY WITH PROPOFOL  Patient location during evaluation: Endoscopy Anesthesia Type: General Level of consciousness: awake and alert Pain management: pain level controlled Vital Signs Assessment: post-procedure vital signs reviewed and stable Respiratory status: spontaneous breathing, nonlabored ventilation, respiratory function stable and patient connected to nasal cannula oxygen Cardiovascular status: blood pressure returned to baseline and stable Postop Assessment: no apparent nausea or vomiting Anesthetic complications: no   No notable events documented.   Last Vitals:  Vitals:   02/18/22 1115 02/18/22 1117  BP:  (!) 123/56  Pulse:    Resp: 14 15  Temp:    SpO2:  97%    Last Pain:  Vitals:   02/18/22 1047  TempSrc: Temporal  PainSc: Asleep                 Martha Clan

## 2022-09-08 ENCOUNTER — Ambulatory Visit (INDEPENDENT_AMBULATORY_CARE_PROVIDER_SITE_OTHER): Payer: PPO | Admitting: Family Medicine

## 2022-09-08 ENCOUNTER — Encounter: Payer: Self-pay | Admitting: Family Medicine

## 2022-09-08 VITALS — BP 120/62 | HR 58 | Temp 97.8°F | Ht 62.0 in | Wt 146.0 lb

## 2022-09-08 DIAGNOSIS — W57XXXA Bitten or stung by nonvenomous insect and other nonvenomous arthropods, initial encounter: Secondary | ICD-10-CM | POA: Diagnosis not present

## 2022-09-08 DIAGNOSIS — S70361A Insect bite (nonvenomous), right thigh, initial encounter: Secondary | ICD-10-CM | POA: Diagnosis not present

## 2022-09-08 MED ORDER — DOXYCYCLINE HYCLATE 100 MG PO TABS: 100.0000 mg | ORAL_TABLET | Freq: Two times a day (BID) | ORAL | 0 refills | Status: DC

## 2022-09-08 NOTE — Patient Instructions (Signed)
Keep track of your temperature If you feel worse or new symptoms let me know  Or any rash or more spots   Keep check for ticks Insect repellent if outside   Take the doxycycline as directed   Labs today

## 2022-09-08 NOTE — Assessment & Plan Note (Signed)
Pt had both a tick bite on right foot and presumably right upper thigh  Small/non engorged ticks  Lesion on leg resembles erythema migrans Having some chills at night and fatigue (encouraged to check temp) Discussed treatment of tick bourne illness Will start doxycycline bid 10 d Labs pending also  Instructed to watch for worse symptoms / rash /fever or anything new Discussed avoiding tick bites   ER precautions noted See AVS

## 2022-09-08 NOTE — Progress Notes (Signed)
Subjective:    Patient ID: Kelsey Haley, female    DOB: 1946-01-04, 77 y.o.   MRN: [id number]  HPI  Wt Readings from Last 3 Encounters:  09/08/22 146 lb (66.2 kg)  02/18/22 148 lb 4.9 oz (67.3 kg)  01/31/22 151 lb (68.5 kg)   26.70 kg/m  Vitals:   09/08/22 1557  BP: 120/62  Pulse: (!) 58  Temp: 97.8 F (36.6 C)  SpO2: 96%     Pt presents for c/o tick bite with rash    Bite 3-4 weeks ago on right foot -daughter removed it  Very small tick (got it all out)  It had been there for over a day (not engorged)  Still has a red mark   Was bitten after looking at daughter's lot   Has a bigger bulls eye lesion on right inner thigh- started on saturday Itching   Had some chills for a few nights Had a headache a few weeks ago and it got better  Also prone to vertigo   No n/v    Took benadryl and tylenol   No rash elsewhere       Patient Active Problem List   Diagnosis Date Noted   Tick bite of right thigh 09/08/2022   Personal history of colonic polyps 02/18/2022   Cecal polyp 02/18/2022   Polyp of ascending colon 02/18/2022   Mitral and aortic regurgitation 01/31/2022   PAC (premature atrial contraction) 11/27/2021   PVC (premature ventricular contraction) 11/27/2021   Abnormal EKG 11/27/2021   Near syncope 09/24/2021   Pre-operative clearance 09/24/2021   Zoster 10/04/2019   Family history of uterine cancer 04/20/2017   Screening mammogram, encounter for 11/12/2016   Encounter for Medicare annual wellness exam 05/16/2015   Stress headaches 05/16/2015   Estrogen deficiency 05/16/2015   Lipoma 12/20/2014   Encounter for routine gynecological examination 08/16/2012   Routine general medical examination at a health care facility 08/10/2012   Vitamin D deficiency 01/08/2010   HYPERCHOLESTEROLEMIA 01/08/2010   COLONIC POLYPS 10/01/2009   Hearing loss 11/22/2008   INTERMITTENT VERTIGO 11/22/2008   Osteopenia 01/27/2007   Past Medical History:   Diagnosis Date   Encounter for Medicare annual wellness exam 05/16/2015   Estrogen deficiency 05/16/2015   Family history of uterine cancer 04/20/2017   Headache    occasional migranes   Hearing loss    Hearing loss 11/22/2008   Qualifier: Diagnosis of  By: Milinda Antis MD, Colon Flattery    HYPERCHOLESTEROLEMIA 01/08/2010   Qualifier: Diagnosis of  By: Alice Reichert CMA (AAMA), Natasha     INTERMITTENT VERTIGO 11/22/2008   Qualifier: Diagnosis of  By: Milinda Antis MD, Colon Flattery    Lipoma 12/20/2014   On L back    Osteopenia    Rash and nonspecific skin eruption 05/16/2015   Atopic? -occ neck or L hand  Uses triamcinolone    Screening mammogram, encounter for 11/12/2016   Stress headaches 05/16/2015   Vertigo    Vitamin D deficiency 01/08/2010   Qualifier: Diagnosis of  By: Alice Reichert CMA (AAMA), Marcelle Smiling     Past Surgical History:  Procedure Laterality Date   COLONOSCOPY WITH PROPOFOL N/A 08/20/2015   Procedure: COLONOSCOPY WITH PROPOFOL;  Surgeon: Scot Jun, MD;  Location: [facility] ENDOSCOPY;  Service: Endoscopy;  Laterality: N/A;   COLONOSCOPY WITH PROPOFOL N/A 02/18/2022   Procedure: COLONOSCOPY WITH PROPOFOL;  Surgeon: Toney Reil, MD;  Location: [facility] ENDOSCOPY;  Service: Gastroenterology;  Laterality: N/A;   left knee surgery  TONSILLECTOMY     Social History   Tobacco Use   Smoking status: Former    Types: Cigarettes    Quit date: 03/17/1994    Years since quitting: 28.4   Smokeless tobacco: Never   Tobacco comments:    was a light smoker before quitting  Vaping Use   Vaping Use: Never used  Substance Use Topics   Alcohol use: Not Currently    Alcohol/week: 1.0 standard drink of alcohol    Types: 1 Glasses of wine per week    Comment: occasional   Drug use: No   Family History  Problem Relation Age of Onset   Hypotension Mother    Hypertension Father    Prostate cancer Father    Uterine cancer Sister    Prostate cancer Brother    Breast cancer Neg Hx    No Known Allergies Current  Outpatient Medications on File Prior to Visit  Medication Sig Dispense Refill   Ascorbic Acid (VITAMIN C) 1000 MG tablet Take 1,000 mg by mouth daily.     b complex vitamins capsule Take 1 capsule by mouth daily.     Calcium-Magnesium-Zinc 500-250-12.5 MG TABS Take 2 tablets by mouth daily.     Cholecalciferol (VITAMIN D-3) 5000 units TABS Take 1 tablet by mouth daily.     Potassium Gluconate 550 MG TABS Take 1 tablet by mouth daily.     Turmeric (QC TUMERIC COMPLEX PO) Take by mouth.     No current facility-administered medications on file prior to visit.    Review of Systems  Constitutional:  Positive for chills and fatigue. Negative for activity change, appetite change, fever and unexpected weight change.       Unsure if fever  HENT:  Negative for congestion, ear pain, rhinorrhea, sinus pressure and sore throat.   Eyes:  Negative for pain, redness and visual disturbance.  Respiratory:  Negative for cough, shortness of breath and wheezing.   Cardiovascular:  Negative for chest pain and palpitations.  Gastrointestinal:  Negative for abdominal pain, blood in stool, constipation and diarrhea.  Endocrine: Negative for polydipsia and polyuria.  Genitourinary:  Negative for dysuria, frequency and urgency.  Musculoskeletal:  Negative for arthralgias, back pain and myalgias.  Skin:  Positive for rash. Negative for pallor.  Allergic/Immunologic: Negative for environmental allergies.  Neurological:  Negative for dizziness, syncope and headaches.       Headache is better now   Hematological:  Negative for adenopathy. Does not bruise/bleed easily.  Psychiatric/Behavioral:  Negative for decreased concentration and dysphoric mood. The patient is not nervous/anxious.        Objective:   Physical Exam Constitutional:      General: She is not in acute distress.    Appearance: Normal appearance. She is well-developed and normal weight. She is not ill-appearing or diaphoretic.  HENT:     Head:  Normocephalic and atraumatic.     Mouth/Throat:     Mouth: Mucous membranes are moist.     Pharynx: Oropharynx is clear.  Eyes:     General: No scleral icterus.       Right eye: No discharge.        Left eye: No discharge.     Conjunctiva/sclera: Conjunctivae normal.     Pupils: Pupils are equal, round, and reactive to light.  Neck:     Thyroid: No thyromegaly.     Vascular: No carotid bruit or JVD.  Cardiovascular:     Rate and Rhythm: Regular rhythm. Bradycardia  present.     Heart sounds: Normal heart sounds.     No gallop.  Pulmonary:     Effort: Pulmonary effort is normal. No respiratory distress.     Breath sounds: Normal breath sounds. No wheezing or rales.  Abdominal:     General: There is no distension or abdominal bruit.     Palpations: Abdomen is soft.  Musculoskeletal:     Cervical back: Normal range of motion and neck supple.     Right lower leg: No edema.     Left lower leg: No edema.  Lymphadenopathy:     Cervical: No cervical adenopathy.  Skin:    General: Skin is warm and dry.     Coloration: Skin is not pale.     Findings: No rash.     Comments: See picture- large oval area of erythema in a bullseye pattern with central scab on right inner thigh  Small (1 cm ) area of erythema on dorsal right foot  No excoriation  No other skin findings    Neurological:     Mental Status: She is alert.     Coordination: Coordination normal.     Deep Tendon Reflexes: Reflexes are normal and symmetric. Reflexes normal.  Psychiatric:        Mood and Affect: Mood normal.           Assessment & Plan:   Problem List Items Addressed This Visit       Musculoskeletal and Integument   Tick bite of right thigh - Primary    Pt had both a tick bite on right foot and presumably right upper thigh  Small/non engorged ticks  Lesion on leg resembles erythema migrans Having some chills at night and fatigue (encouraged to check temp) Discussed treatment of tick bourne  illness Will start doxycycline bid 10 d Labs pending also  Instructed to watch for worse symptoms / rash /fever or anything new Discussed avoiding tick bites   ER precautions noted See AVS      Relevant Orders   B. burgdorfi antibodies by WB   Rocky mtn spotted fvr abs pnl(IgG+IgM)

## 2022-09-14 LAB — B. BURGDORFI ANTIBODIES BY WB
B burgdorferi IgG Abs (IB): NEGATIVE
B burgdorferi IgM Abs (IB): NEGATIVE
Lyme Disease 18 kD IgG: NONREACTIVE
Lyme Disease 23 kD IgG: NONREACTIVE
Lyme Disease 23 kD IgM: NONREACTIVE
Lyme Disease 28 kD IgG: NONREACTIVE
Lyme Disease 30 kD IgG: NONREACTIVE
Lyme Disease 39 kD IgG: NONREACTIVE
Lyme Disease 39 kD IgM: NONREACTIVE
Lyme Disease 41 kD IgG: REACTIVE — AB
Lyme Disease 41 kD IgM: NONREACTIVE
Lyme Disease 45 kD IgG: NONREACTIVE
Lyme Disease 58 kD IgG: REACTIVE — AB
Lyme Disease 66 kD IgG: NONREACTIVE
Lyme Disease 93 kD IgG: REACTIVE — AB

## 2022-09-14 LAB — ROCKY MTN SPOTTED FVR ABS PNL(IGG+IGM)
RMSF IgG: NOT DETECTED
RMSF IgM: NOT DETECTED

## 2022-10-30 ENCOUNTER — Encounter (INDEPENDENT_AMBULATORY_CARE_PROVIDER_SITE_OTHER): Payer: Self-pay

## 2023-04-20 DIAGNOSIS — H3552 Pigmentary retinal dystrophy: Secondary | ICD-10-CM | POA: Diagnosis not present

## 2023-04-20 DIAGNOSIS — H2513 Age-related nuclear cataract, bilateral: Secondary | ICD-10-CM | POA: Diagnosis not present

## 2023-04-20 DIAGNOSIS — H524 Presbyopia: Secondary | ICD-10-CM | POA: Diagnosis not present

## 2023-04-20 DIAGNOSIS — H25013 Cortical age-related cataract, bilateral: Secondary | ICD-10-CM | POA: Diagnosis not present

## 2023-04-21 ENCOUNTER — Ambulatory Visit: Payer: PPO | Admitting: Family Medicine

## 2023-04-21 ENCOUNTER — Encounter: Payer: Self-pay | Admitting: Family Medicine

## 2023-04-21 VITALS — BP 128/76 | HR 92 | Temp 98.5°F | Ht 62.0 in | Wt 146.1 lb

## 2023-04-21 DIAGNOSIS — R058 Other specified cough: Secondary | ICD-10-CM

## 2023-04-21 MED ORDER — PREDNISONE 20 MG PO TABS: 20.0000 mg | ORAL_TABLET | Freq: Every day | ORAL | 0 refills | Status: DC

## 2023-04-21 MED ORDER — BENZONATATE 200 MG PO CAPS: 200.0000 mg | ORAL_CAPSULE | Freq: Three times a day (TID) | ORAL | 1 refills | Status: DC | PRN

## 2023-04-21 NOTE — Progress Notes (Signed)
 Subjective:    Patient ID: Kelsey Haley, female    DOB: March 15, 1946, 78 y.o.   MRN: [id number]  HPI  Wt Readings from Last 3 Encounters:  04/21/23 146 lb 2 oz (66.3 kg)  09/08/22 146 lb (66.2 kg)  02/18/22 148 lb 4.9 oz (67.3 kg)   26.73 kg/m  Vitals:   04/21/23 1454  BP: 128/76  Pulse: 92  Temp: 98.5 F (36.9 C)  SpO2: 94%   Pt presents with c/o cough for 1 month    Started with a cold  Congestion   Cough comes and goes (mostly during the day)  Phlegm noted- she does not look at the color  No wheeze No chest tightness  No ST  No chest pain  No ear pain  Feels ok     Used nasal saline  No over the counter cough med  Occational tylenol   Some soreness and stiffness in lateral left leg     Patient Active Problem List   Diagnosis Date Noted   Post-viral cough syndrome 04/21/2023   Tick bite of right thigh 09/08/2022   History of colonic polyps 02/18/2022   Cecal polyp 02/18/2022   Polyp of ascending colon 02/18/2022   Mitral and aortic regurgitation 01/31/2022   PAC (premature atrial contraction) 11/27/2021   PVC (premature ventricular contraction) 11/27/2021   Abnormal EKG 11/27/2021   Near syncope 09/24/2021   Pre-operative clearance 09/24/2021   Zoster 10/04/2019   Family history of uterine cancer 04/20/2017   Screening mammogram, encounter for 11/12/2016   Encounter for Medicare annual wellness exam 05/16/2015   Stress headaches 05/16/2015   Estrogen deficiency 05/16/2015   Lipoma 12/20/2014   Encounter for routine gynecological examination 08/16/2012   Routine general medical examination at a health care facility 08/10/2012   Vitamin D  deficiency 01/08/2010   HYPERCHOLESTEROLEMIA 01/08/2010   COLONIC POLYPS 10/01/2009   Hearing loss 11/22/2008   INTERMITTENT VERTIGO 11/22/2008   Osteopenia 01/27/2007   Past Medical History:  Diagnosis Date   Encounter for Medicare annual wellness exam 05/16/2015   Estrogen deficiency 05/16/2015   Family  history of uterine cancer 04/20/2017   Headache    occasional migranes   Hearing loss    Hearing loss 11/22/2008   Qualifier: Diagnosis of  By: Randeen MD, Laine Caldron    HYPERCHOLESTEROLEMIA 01/08/2010   Qualifier: Diagnosis of  By: Edmond CMA (AAMA), Natasha     INTERMITTENT VERTIGO 11/22/2008   Qualifier: Diagnosis of  By: Randeen MD, Laine Caldron    Lipoma 12/20/2014   On L back    Osteopenia    Rash and nonspecific skin eruption 05/16/2015   Atopic? -occ neck or L hand  Uses triamcinolone     Screening mammogram, encounter for 11/12/2016   Stress headaches 05/16/2015   Vertigo    Vitamin D  deficiency 01/08/2010   Qualifier: Diagnosis of  By: Edmond CMA (AAMA), Mylinda     Past Surgical History:  Procedure Laterality Date   COLONOSCOPY WITH PROPOFOL  N/A 08/20/2015   Procedure: COLONOSCOPY WITH PROPOFOL ;  Surgeon: Lamar ONEIDA Holmes, MD;  Location: [facility] ENDOSCOPY;  Service: Endoscopy;  Laterality: N/A;   COLONOSCOPY WITH PROPOFOL  N/A 02/18/2022   Procedure: COLONOSCOPY WITH PROPOFOL ;  Surgeon: Unk Corinn Skiff, MD;  Location: [facility] ENDOSCOPY;  Service: Gastroenterology;  Laterality: N/A;   left knee surgery     TONSILLECTOMY     Social History   Tobacco Use   Smoking status: Former    Current packs/day: 0.00  Types: Cigarettes    Quit date: 03/17/1994    Years since quitting: 29.1   Smokeless tobacco: Never   Tobacco comments:    was a light smoker before quitting  Vaping Use   Vaping status: Never Used  Substance Use Topics   Alcohol use: Not Currently    Alcohol/week: 1.0 standard drink of alcohol    Types: 1 Glasses of wine per week    Comment: occasional   Drug use: No   Family History  Problem Relation Age of Onset   Hypotension Mother    Hypertension Father    Prostate cancer Father    Uterine cancer Sister    Prostate cancer Brother    Breast cancer Neg Hx    No Known Allergies Current Outpatient Medications on File Prior to Visit  Medication Sig Dispense Refill    Ascorbic Acid (VITAMIN C) 1000 MG tablet Take 1,000 mg by mouth daily.     b complex vitamins capsule Take 1 capsule by mouth daily.     Calcium-Magnesium-Zinc 500-250-12.5 MG TABS Take 2 tablets by mouth daily.     Cholecalciferol (VITAMIN D -3) 5000 units TABS Take 1 tablet by mouth daily.     OVER THE COUNTER MEDICATION Take 3 capsules by mouth daily. TURMERIC, BIOPERINE, GARLIC, GINGER     OVER THE COUNTER MEDICATION Take 1 capsule by mouth daily. MELATONIN, MAGNESIUM, B6, L-THIEANINE, VALERIAN, S HTP, ASHWAGANDA     Potassium Gluconate 550 MG TABS Take 1 tablet by mouth daily.     Turmeric (QC TUMERIC COMPLEX PO) Take by mouth.     No current facility-administered medications on file prior to visit.    Review of Systems  Constitutional:  Negative for activity change, appetite change, fatigue, fever and unexpected weight change.  HENT:  Negative for congestion, ear pain, rhinorrhea, sinus pressure and sore throat.   Eyes:  Negative for pain, redness and visual disturbance.  Respiratory:  Positive for cough. Negative for shortness of breath and wheezing.   Cardiovascular:  Negative for chest pain and palpitations.  Gastrointestinal:  Negative for abdominal pain, blood in stool, constipation and diarrhea.  Endocrine: Negative for polydipsia and polyuria.  Genitourinary:  Negative for dysuria, frequency and urgency.  Musculoskeletal:  Negative for arthralgias, back pain and myalgias.  Skin:  Negative for pallor and rash.  Allergic/Immunologic: Negative for environmental allergies.  Neurological:  Negative for dizziness, syncope and headaches.  Hematological:  Negative for adenopathy. Does not bruise/bleed easily.  Psychiatric/Behavioral:  Negative for decreased concentration and dysphoric mood. The patient is not nervous/anxious.        Objective:   Physical Exam Constitutional:      General: She is not in acute distress.    Appearance: Normal appearance. She is well-developed and  normal weight. She is not ill-appearing or diaphoretic.  HENT:     Head: Normocephalic and atraumatic.  Eyes:     Conjunctiva/sclera: Conjunctivae normal.     Pupils: Pupils are equal, round, and reactive to light.  Neck:     Thyroid : No thyromegaly.     Vascular: No carotid bruit or JVD.  Cardiovascular:     Rate and Rhythm: Normal rate and regular rhythm.     Heart sounds: Normal heart sounds.     No gallop.  Pulmonary:     Effort: Pulmonary effort is normal. No respiratory distress.     Breath sounds: Normal breath sounds. No stridor. No wheezing, rhonchi or rales.     Comments: Good  air exch   Cough is dry and harsh sounding  Abdominal:     General: There is no distension or abdominal bruit.     Palpations: Abdomen is soft.  Musculoskeletal:     Cervical back: Normal range of motion and neck supple.     Right lower leg: No edema.     Left lower leg: No edema.  Lymphadenopathy:     Cervical: No cervical adenopathy.  Skin:    General: Skin is warm and dry.     Coloration: Skin is not pale.     Findings: No rash.  Neurological:     Mental Status: She is alert.     Coordination: Coordination normal.     Deep Tendon Reflexes: Reflexes are normal and symmetric. Reflexes normal.  Psychiatric:        Mood and Affect: Mood normal.           Assessment & Plan:   Problem List Items Addressed This Visit       Respiratory   Post-viral cough syndrome - Primary   S/p viral uri a month ago  Reassuring exam  Discussed goals for cough control  Prednisone  20 mg daily for 5 d  Tessalon  prn tid   Update if not starting to improve in a week or if worsening  Call back and Er precautions noted in detail today

## 2023-04-21 NOTE — Patient Instructions (Addendum)
 I think you have a post viral cough syndrome   Take the prednisone  for airway inflammation (may make you feel hyper and hungry)  Take the tessalon  (benzonatate ) pills for cough control  Update if not starting to improve in a week or if worsening   If worse - call earlier  Also watch for fever or more phlegm   You can also use mucinex dm over the counter too loosen phlegm and control cough   Your lung exam is reassuring

## 2023-04-21 NOTE — Assessment & Plan Note (Signed)
S/p viral uri a month ago  Reassuring exam  Discussed goals for cough control  Prednisone 20 mg daily for 5 d  Tessalon prn tid   Update if not starting to improve in a week or if worsening  Call back and Er precautions noted in detail today

## 2023-06-10 ENCOUNTER — Ambulatory Visit: Admitting: Family Medicine

## 2023-06-10 ENCOUNTER — Encounter: Payer: Self-pay | Admitting: Family Medicine

## 2023-06-10 VITALS — BP 134/82 | HR 65 | Temp 97.8°F | Ht 62.0 in | Wt 145.5 lb

## 2023-06-10 DIAGNOSIS — W57XXXS Bitten or stung by nonvenomous insect and other nonvenomous arthropods, sequela: Secondary | ICD-10-CM

## 2023-06-10 DIAGNOSIS — S70361S Insect bite (nonvenomous), right thigh, sequela: Secondary | ICD-10-CM

## 2023-06-10 DIAGNOSIS — R5382 Chronic fatigue, unspecified: Secondary | ICD-10-CM

## 2023-06-10 DIAGNOSIS — G478 Other sleep disorders: Secondary | ICD-10-CM | POA: Diagnosis not present

## 2023-06-10 DIAGNOSIS — E559 Vitamin D deficiency, unspecified: Secondary | ICD-10-CM | POA: Diagnosis not present

## 2023-06-10 LAB — COMPREHENSIVE METABOLIC PANEL WITH GFR
ALT: 14 U/L (ref 0–35)
AST: 20 U/L (ref 0–37)
Albumin: 4.7 g/dL (ref 3.5–5.2)
Alkaline Phosphatase: 56 U/L (ref 39–117)
BUN: 26 mg/dL — ABNORMAL HIGH (ref 6–23)
CO2: 30 meq/L (ref 19–32)
Calcium: 10.1 mg/dL (ref 8.4–10.5)
Chloride: 103 meq/L (ref 96–112)
Creatinine, Ser: 0.7 mg/dL (ref 0.40–1.20)
GFR: 83.52 mL/min (ref >60.00)
Glucose, Bld: 102 mg/dL — ABNORMAL HIGH (ref 70–99)
Potassium: 4.7 meq/L (ref 3.5–5.1)
Sodium: 140 meq/L (ref 135–145)
Total Bilirubin: 0.5 mg/dL (ref 0.2–1.2)
Total Protein: 7.5 g/dL (ref 6.0–8.3)

## 2023-06-10 LAB — CBC WITH DIFFERENTIAL/PLATELET
Basophils Absolute: 0 10*3/uL (ref 0.0–0.1)
Basophils Relative: 0.6 % (ref 0.0–3.0)
Eosinophils Absolute: 0.2 10*3/uL (ref 0.0–0.7)
Eosinophils Relative: 3.7 % (ref 0.0–5.0)
HCT: 43.6 % (ref 36.0–46.0)
Hemoglobin: 14.5 g/dL (ref 12.0–15.0)
Lymphocytes Relative: 36.4 % (ref 12.0–46.0)
Lymphs Abs: 2.3 10*3/uL (ref 0.7–4.0)
MCHC: 33.4 g/dL (ref 30.0–36.0)
MCV: 98.7 fl (ref 78.0–100.0)
Monocytes Absolute: 0.6 10*3/uL (ref 0.1–1.0)
Monocytes Relative: 9.4 % (ref 3.0–12.0)
Neutro Abs: 3.2 10*3/uL (ref 1.4–7.7)
Neutrophils Relative %: 49.9 % (ref 43.0–77.0)
Platelets: 269 10*3/uL (ref 150.0–400.0)
RBC: 4.42 Mil/uL (ref 3.87–5.11)
RDW: 13.6 % (ref 11.5–15.5)
WBC: 6.4 10*3/uL (ref 4.0–10.5)

## 2023-06-10 LAB — SEDIMENTATION RATE: Sed Rate: 9 mm/h (ref 0–30)

## 2023-06-10 NOTE — Progress Notes (Signed)
 Ph: [phone] Fax: [phone]   Patient ID: Kelsey Haley, female    DOB: 1945/08/08, 78 y.o.   MRN: [id number]  This visit was conducted in person.  BP 134/82   Pulse 65   Temp 97.8 F (36.6 C) (Oral)   Ht 5\' 2"  (1.575 m)   Wt 145 lb 8 oz (66 kg)   SpO2 99%   BMI 26.61 kg/m   BP Readings from Last 3 Encounters:  06/10/23 134/82  04/21/23 128/76  09/08/22 120/62   CC: fatigue Subjective:   HPI: Kelsey Haley is a 78 y.o. female presenting on 06/10/2023 for Fatigue (C/o feeling tired. Sxs started about 2 yrs ago. )   Spanish-speaking.  Patient of Dr Milinda Antis, new to me, presents with 2 year history of fatigue predominantly noted worse in am when she wakes up.  She continues to enjoy her hobbies - art, staying physically active - but feels too low energy to pursue these as previously.   OSA eval: No morning headaches.  + snoring according to husband.  Non-restorative sleep.  No witnessed apnea or PNDyspnea.  No significant daytime somnolence.  Poor sleep quality - can wake up 5 hours after sleep.  ESS = 16  She is taking a sleep supplement with melatonin.  She had concern for parasites in stool after a trip to [place]. She treated this with Epazote tea (from [place]) with benefit.   No fevers/chills, no new rash, no swollen glands, new joint pains, no abd pain, nausea/vomiting, diarrhea/constipation, chest pain, palpitations, dizziness, headache, dyspnea, leg swelling. No unexpected weight loss, no anorexia. No paresthesias or neurological symptoms.   Weight has been stable.   Last CPE with PCP 10/2021.   H/o syncope while visiting [place] last year. She was hospitalized in [facility] for 2 days with echocardiogram revealing mild MR and AI. Attributed to dehydration. Doesn't think had true syncope. On return saw cardiology Dr End with overall reassuring evaluation. Did find PAC/PVCs.   Saw Dr Milinda Antis last month with 1 month of cough thought post-viral cough syndrome  treated with prednisone 5d course - this is improved.   H/o tick bite seen 09/08/2022 with some concern for Erythema Migrans, tested at the time negative for Lyme disease and RMSF. Treated with 10d doxycycline course.   Last colonoscopy 02/2022 - 6 polyps - rec rpt 3-5 yrs ([facility])  H/o migraines and vertigo - this is largely stable.  DEXA 03/2018 - T -1.2 spine DEXA 01/2022 - T -0.9 spine overall improved (normal)     Relevant past medical, surgical, family and social history reviewed and updated as indicated. Interim medical history since our last visit reviewed. Allergies and medications reviewed and updated. Outpatient Medications Prior to Visit  Medication Sig Dispense Refill   Ascorbic Acid (VITAMIN C) 1000 MG tablet Take 1,000 mg by mouth daily.     b complex vitamins capsule Take 1 capsule by mouth daily.     Calcium-Magnesium-Zinc 500-250-12.5 MG TABS Take 2 tablets by mouth daily.     Cholecalciferol (VITAMIN D-3) 5000 units TABS Take 1 tablet by mouth daily.     OVER THE COUNTER MEDICATION Take 3 capsules by mouth daily. TURMERIC, BIOPERINE, GARLIC, GINGER     OVER THE COUNTER MEDICATION Take 1 capsule by mouth daily. MELATONIN, MAGNESIUM, B6, L-THIEANINE, VALERIAN, S HTP, ASHWAGANDA     Potassium Gluconate 550 MG TABS Take 1 tablet by mouth daily.     Turmeric (QC TUMERIC COMPLEX PO) Take  by mouth.     benzonatate (TESSALON) 200 MG capsule Take 1 capsule (200 mg total) by mouth 3 (three) times daily as needed for cough. Swallow whole 30 capsule 1   predniSONE (DELTASONE) 20 MG tablet Take 1 tablet (20 mg total) by mouth daily with breakfast. 10 tablet 0   No facility-administered medications prior to visit.     Per HPI unless specifically indicated in ROS section below Review of Systems  Objective:  BP 134/82   Pulse 65   Temp 97.8 F (36.6 C) (Oral)   Ht 5\' 2"  (1.575 m)   Wt 145 lb 8 oz (66 kg)   SpO2 99%   BMI 26.61 kg/m   Wt Readings from Last 3 Encounters:   06/10/23 145 lb 8 oz (66 kg)  04/21/23 146 lb 2 oz (66.3 kg)  09/08/22 146 lb (66.2 kg)      Physical Exam Vitals and nursing note reviewed.  Constitutional:      Appearance: Normal appearance. She is not ill-appearing.  HENT:     Head: Normocephalic and atraumatic.     Right Ear: Tympanic membrane, ear canal and external ear normal. There is no impacted cerumen.     Left Ear: Tympanic membrane, ear canal and external ear normal. There is no impacted cerumen.     Ears:     Comments: Hearing aides removed    Mouth/Throat:     Mouth: Mucous membranes are moist.     Pharynx: Oropharynx is clear. No oropharyngeal exudate or posterior oropharyngeal erythema.  Eyes:     General:        Right eye: No discharge.        Left eye: No discharge.     Extraocular Movements: Extraocular movements intact.     Conjunctiva/sclera: Conjunctivae normal.     Pupils: Pupils are equal, round, and reactive to light.  Neck:     Thyroid: No thyroid mass or thyromegaly.  Cardiovascular:     Rate and Rhythm: Normal rate and regular rhythm.     Pulses: Normal pulses.     Heart sounds: Normal heart sounds. No murmur heard. Pulmonary:     Effort: Pulmonary effort is normal. No respiratory distress.     Breath sounds: Normal breath sounds. No wheezing, rhonchi or rales.  Abdominal:     General: Bowel sounds are normal. There is no distension.     Palpations: Abdomen is soft. There is no mass.     Tenderness: There is no abdominal tenderness. There is no guarding or rebound.     Hernia: No hernia is present.  Musculoskeletal:     Cervical back: Normal range of motion and neck supple. No rigidity.     Right lower leg: No edema.     Left lower leg: No edema.  Lymphadenopathy:     Cervical: No cervical adenopathy.  Skin:    General: Skin is warm and dry.     Findings: No rash.  Neurological:     General: No focal deficit present.     Mental Status: She is alert. Mental status is at baseline.   Psychiatric:        Mood and Affect: Mood normal.        Behavior: Behavior normal.       Results for orders placed or performed in visit on 06/10/23  Comprehensive metabolic panel   Collection Time: 06/10/23 11:19 AM  Result Value Ref Range   Sodium 140 135 - 145 mEq/L  Potassium 4.7 3.5 - 5.1 mEq/L   Chloride 103 96 - 112 mEq/L   CO2 30 19 - 32 mEq/L   Glucose, Bld 102 (H) 70 - 99 mg/dL   BUN 26 (H) 6 - 23 mg/dL   Creatinine, Ser 0.98 0.40 - 1.20 mg/dL   Total Bilirubin 0.5 0.2 - 1.2 mg/dL   Alkaline Phosphatase 56 39 - 117 U/L   AST 20 0 - 37 U/L   ALT 14 0 - 35 U/L   Total Protein 7.5 6.0 - 8.3 g/dL   Albumin 4.7 3.5 - 5.2 g/dL   GFR 11.91 >47.82 mL/min   Calcium 10.1 8.4 - 10.5 mg/dL  TSH   Collection Time: 06/13/2023 11:19 AM  Result Value Ref Range   TSH 2.28 0.35 - 5.50 uIU/mL  CBC with Differential/Platelet   Collection Time: 06/13/2023 11:19 AM  Result Value Ref Range   WBC 6.4 4.0 - 10.5 K/uL   RBC 4.42 3.87 - 5.11 Mil/uL   Hemoglobin 14.5 12.0 - 15.0 g/dL   HCT 95.6 21.3 - 08.6 %   MCV 98.7 78.0 - 100.0 fl   MCHC 33.4 30.0 - 36.0 g/dL   RDW 57.8 46.9 - 62.9 %   Platelets 269.0 150.0 - 400.0 K/uL   Neutrophils Relative % 49.9 43.0 - 77.0 %   Lymphocytes Relative 36.4 12.0 - 46.0 %   Monocytes Relative 9.4 3.0 - 12.0 %   Eosinophils Relative 3.7 0.0 - 5.0 %   Basophils Relative 0.6 0.0 - 3.0 %   Neutro Abs 3.2 1.4 - 7.7 K/uL   Lymphs Abs 2.3 0.7 - 4.0 K/uL   Monocytes Absolute 0.6 0.1 - 1.0 K/uL   Eosinophils Absolute 0.2 0.0 - 0.7 K/uL   Basophils Absolute 0.0 0.0 - 0.1 K/uL  Vitamin B12   Collection Time: 06/13/23 11:19 AM  Result Value Ref Range   Vitamin B-12 1,078 (H) 211 - 911 pg/mL  VITAMIN D 25 Hydroxy (Vit-D Deficiency, Fractures)   Collection Time: 06/13/23 11:19 AM  Result Value Ref Range   VITD 68.76 30.00 - 100.00 ng/mL  B. burgdorfi antibodies by WB   Collection Time: 2023/06/13 11:19 AM  Result Value Ref Range   B burgdorferi IgG Abs  (IB) NEGATIVE NEGATIVE   Lyme Disease 18 kD IgG NON-REACTIVE    Lyme Disease 23 kD IgG NON-REACTIVE    Lyme Disease 28 kD IgG NON-REACTIVE    Lyme Disease 30 kD IgG NON-REACTIVE    Lyme Disease 39 kD IgG NON-REACTIVE    Lyme Disease 41 kD IgG NON-REACTIVE    Lyme Disease 45 kD IgG NON-REACTIVE    Lyme Disease 58 kD IgG NON-REACTIVE    Lyme Disease 66 kD IgG NON-REACTIVE    Lyme Disease 93 kD IgG NON-REACTIVE    B burgdorferi IgM Abs (IB) NEGATIVE NEGATIVE   Lyme Disease 23 kD IgM REACTIVE (A)    Lyme Disease 39 kD IgM NON-REACTIVE    Lyme Disease 41 kD IgM NON-REACTIVE   Sedimentation rate   Collection Time: 2023/06/13 11:19 AM  Result Value Ref Range   Sed Rate 9 0 - 30 mm/hr      13-Jun-2023   10:35 AM 04/21/2023    3:11 PM 09/08/2022    4:28 PM 11/20/2021    2:30 PM 10/15/2021    2:03 PM  Depression screen PHQ 2/9  Decreased Interest 0 0 0 0 0  Down, Depressed, Hopeless 0 0 0 0 0  PHQ - 2  Score 0 0 0 0 0  Altered sleeping 0 0 0 0   Tired, decreased energy 3 0 0 0   Change in appetite 0 0 0 0   Feeling bad or failure about yourself  0 0 0 0   Trouble concentrating 0 0 0 0   Moving slowly or fidgety/restless 0 0 0 0   Suicidal thoughts 0 0 0 0   PHQ-9 Score 3 0 0 0   Difficult doing work/chores Not difficult at all Not difficult at all Not difficult at all Not difficult at all        06/10/2023   10:35 AM 04/21/2023    3:12 PM 09/08/2022    4:28 PM 07/29/2019   10:59 AM  GAD 7 : Generalized Anxiety Score  Nervous, Anxious, on Edge 0 0 0 0  Control/stop worrying 0 0 0 0  Worry too much - different things 0 0 0 0  Trouble relaxing 0 0 0 0  Restless 0 0 0 0  Easily annoyed or irritable 0 0 0 0  Afraid - awful might happen 0 0 0 0  Total GAD 7 Score 0 0 0 0  Anxiety Difficulty  Not difficult at all Not difficult at all Not difficult at all   Assessment & Plan:   Problem List Items Addressed This Visit     Vitamin D deficiency   Update levels on daily vit D3 5000 units.        Relevant Orders   VITAMIN D 25 Hydroxy (Vit-D Deficiency, Fractures) (Completed)   Tick bite of right thigh   H/o this 08/2022, lyme and RMSF testing at that time negative.  She was treated with 10d doxycycline course. Will update lyme disease testing to evaluate for chronic lyme disease contributing to fatigue.       Relevant Orders   B. burgdorfi antibodies by WB (Completed)   Chronic fatigue - Primary   Longstanding issue. Start with labwork to eval for reversible causes of fatigue See below re: non-restorative sleep.       Relevant Orders   Comprehensive metabolic panel (Completed)   TSH (Completed)   CBC with Differential/Platelet (Completed)   Vitamin B12 (Completed)   VITAMIN D 25 Hydroxy (Vit-D Deficiency, Fractures) (Completed)   B. burgdorfi antibodies by WB (Completed)   Sedimentation rate (Completed)   Non-restorative sleep   Endorses non-restorative sleep, loud snoring, significant early am fatigue but without significant daytime somnolence.  ESS today elevated at 16. If labwork unrevealing, consider home sleep test vs neuro/pulm referral for OSA evaluation.         No orders of the defined types were placed in this encounter.   Orders Placed This Encounter  Procedures   Comprehensive metabolic panel   TSH   CBC with Differential/Platelet   Vitamin B12   VITAMIN D 25 Hydroxy (Vit-D Deficiency, Fractures)   B. burgdorfi antibodies by WB   Sedimentation rate    Patient Instructions  Laboratorios hoy - la contactaremos con los [facility].  Sleep questionnaire today  Gusto verla hoy Regresar para fiscio cuando pueda.   Follow up plan: Return if symptoms worsen or fail to improve.  Eustaquio Boyden, MD

## 2023-06-10 NOTE — Patient Instructions (Addendum)
 Laboratorios hoy - la contactaremos con los [place].  Sleep questionnaire today  Gusto verla hoy Regresar para fiscio cuando pueda.

## 2023-06-11 LAB — VITAMIN B12: Vitamin B-12: 1078 pg/mL — ABNORMAL HIGH (ref 211–911)

## 2023-06-11 LAB — VITAMIN D 25 HYDROXY (VIT D DEFICIENCY, FRACTURES): VITD: 68.76 ng/mL (ref 30.00–100.00)

## 2023-06-11 LAB — TSH: TSH: 2.28 u[IU]/mL (ref 0.35–5.50)

## 2023-06-12 LAB — B. BURGDORFI ANTIBODIES BY WB
B burgdorferi IgG Abs (IB): NEGATIVE
B burgdorferi IgM Abs (IB): NEGATIVE
Lyme Disease 18 kD IgG: NONREACTIVE
Lyme Disease 23 kD IgG: NONREACTIVE
Lyme Disease 23 kD IgM: REACTIVE — AB
Lyme Disease 28 kD IgG: NONREACTIVE
Lyme Disease 30 kD IgG: NONREACTIVE
Lyme Disease 39 kD IgG: NONREACTIVE
Lyme Disease 39 kD IgM: NONREACTIVE
Lyme Disease 41 kD IgG: NONREACTIVE
Lyme Disease 41 kD IgM: NONREACTIVE
Lyme Disease 45 kD IgG: NONREACTIVE
Lyme Disease 58 kD IgG: NONREACTIVE
Lyme Disease 66 kD IgG: NONREACTIVE
Lyme Disease 93 kD IgG: NONREACTIVE

## 2023-06-13 ENCOUNTER — Encounter: Payer: Self-pay | Admitting: Family Medicine

## 2023-06-13 DIAGNOSIS — G478 Other sleep disorders: Secondary | ICD-10-CM | POA: Insufficient documentation

## 2023-06-13 DIAGNOSIS — G4733 Obstructive sleep apnea (adult) (pediatric): Secondary | ICD-10-CM | POA: Insufficient documentation

## 2023-06-13 NOTE — Assessment & Plan Note (Signed)
 Endorses non-restorative sleep, loud snoring, significant early am fatigue but without significant daytime somnolence.  ESS today elevated at 16. If labwork unrevealing, consider home sleep test vs neuro/pulm referral for OSA evaluation.

## 2023-06-13 NOTE — Assessment & Plan Note (Signed)
 Longstanding issue. Start with labwork to eval for reversible causes of fatigue See below re: non-restorative sleep.

## 2023-06-13 NOTE — Assessment & Plan Note (Deleted)
 Found on echocardiogram in [place], mild.

## 2023-06-13 NOTE — Assessment & Plan Note (Signed)
 Update levels on daily vit D3 5000 units.

## 2023-06-13 NOTE — Assessment & Plan Note (Signed)
 H/o this 08/2022, lyme and RMSF testing at that time negative.  She was treated with 10d doxycycline course. Will update lyme disease testing to evaluate for chronic lyme disease contributing to fatigue.

## 2023-07-13 DIAGNOSIS — G4730 Sleep apnea, unspecified: Secondary | ICD-10-CM | POA: Diagnosis not present

## 2023-08-12 ENCOUNTER — Telehealth: Payer: Self-pay | Admitting: Family Medicine

## 2023-08-12 DIAGNOSIS — G4733 Obstructive sleep apnea (adult) (pediatric): Secondary | ICD-10-CM

## 2023-08-12 NOTE — Telephone Encounter (Signed)
Lvm asking pt to call back. Need to relay results and Dr. Timoteo Expose message.

## 2023-08-12 NOTE — Addendum Note (Signed)
 Addended by: Claire Crick on: 08/12/2023 05:43 PM   Modules accepted: Orders

## 2023-08-12 NOTE — Telephone Encounter (Signed)
 Copied from CRM [id number]. Topic: Clinical - Lab/Test Results >> Aug 12, 2023  1:08 PM Jim Motts C wrote: Reason for CRM: Daughter of patient called in, Moira Andrews for patients sleep apnea test. I did relay results to daughter but she would also appreciate a call back to her number [phone] for additional questions that she has.

## 2023-08-12 NOTE — Telephone Encounter (Signed)
 Spoke with daughter Guadalupe Kerekes has a twin with OSA who used CPAP with benefit.  Discussed HST results.  Will refer to [facility] [place] for further treatment of mod-severe OSA.  # provided to schedule an appointment: [phone].

## 2023-08-12 NOTE — Telephone Encounter (Signed)
 Please notify home sleep study showed moderate obstructive sleep apnea with evidence of significant drops in oxygen overnight.  Do recommend trial of CPAP therapy. If she agrees, I will refer her for auto-CPAP commencement.  She will then need a follow up office visit 6 months after starting CPAP to review use/effect, to let us  know sooner if any trouble with this.   HST 07/2023 - AHI 26, RDI 86, nadir O2 sat 75%, 54% time spent <88% saturation - rec CPAP.

## 2023-08-13 ENCOUNTER — Encounter: Payer: Self-pay | Admitting: Family Medicine

## 2023-08-13 ENCOUNTER — Encounter: Payer: Self-pay | Admitting: Cardiology

## 2023-08-13 ENCOUNTER — Ambulatory Visit: Attending: Cardiology | Admitting: Cardiology

## 2023-08-13 VITALS — BP 120/74 | HR 77 | Ht 62.0 in | Wt 140.6 lb

## 2023-08-13 DIAGNOSIS — I080 Rheumatic disorders of both mitral and aortic valves: Secondary | ICD-10-CM

## 2023-08-13 DIAGNOSIS — I493 Ventricular premature depolarization: Secondary | ICD-10-CM

## 2023-08-13 DIAGNOSIS — I491 Atrial premature depolarization: Secondary | ICD-10-CM | POA: Diagnosis not present

## 2023-08-13 DIAGNOSIS — R55 Syncope and collapse: Secondary | ICD-10-CM | POA: Diagnosis not present

## 2023-08-13 DIAGNOSIS — R9431 Abnormal electrocardiogram [ECG] [EKG]: Secondary | ICD-10-CM | POA: Diagnosis not present

## 2023-08-13 DIAGNOSIS — G4730 Sleep apnea, unspecified: Secondary | ICD-10-CM

## 2023-08-13 NOTE — Patient Instructions (Signed)
 Medication Instructions:  Your Physician recommend you continue on your current medication as directed.    *If you need a refill on your cardiac medications before your next appointment, please call your pharmacy*  Lab Work: No labs ordered today  If you have labs (blood work) drawn today and your tests are completely normal, you will receive your results only by: MyChart Message (if you have MyChart) OR A paper copy in the mail If you have any lab test that is abnormal or we need to change your treatment, we will call you to review the results.  Testing/Procedures: No test ordered today   Follow-Up: At [facility], you and your health needs are our priority.  As part of our continuing mission to provide you with exceptional heart care, our providers are all part of one team.  This team includes your primary Cardiologist (physician) and Advanced Practice Providers or APPs (Physician Assistants and Nurse Practitioners) who all work together to provide you with the care you need, when you need it.  Your next appointment:   12 month(s)  Provider:   Ronald Cockayne, NP

## 2023-08-13 NOTE — Progress Notes (Signed)
 Cardiology Office Note:  .   Date:  08/13/2023  ID:  Kelsey Haley, DOB 09-Jul-1945, MRN [id number] PCP: Claire Crick, MD  [facility] Providers Cardiologist:  None    History of Present Illness: Kelsey Haley   Kelsey Haley is a 78 y.o. female with past medical history of recurrent lightheadedness/near syncope, hyperlipidemia, intermittent vertigo, migraine headaches, and back lipoma, who is here today for follow-up.   Previously suffered a near syncopal episode while vacationing in [place]  back in 2023.  She felt lightheaded and leaned her head onto the table.  Today when she sat up she was completely blacked out, falling backwards and hitting her head on the ground.  She had had similar episodes in the past and notes that she had significant lightheadedness every few weeks.  Her hospital course was unremarkable with cardiac markers being negative.  Echocardiogram showed normal LVEF with G1 DD and mild aortic and mitral regurgitation.  Regarding his syncopal episode was felt to be vasovagal in nature.  She underwent an exercise Myoview  which showed transient ST changes during stress but no evidence of ischemia or scar on the perfusion imaging.  There was also no coronary artery calcification.  She was placed on event monitor which showed frequent PACs with occasional PVC's but no sustained arrhythmia to explain her near syncope.   She was last seen in clinic 01/31/2022 by Dr. Nolan Battle.  At that time she was feeling well.  She just returned from [place] the month before.  She denies any recurrent lightheadedness or near syncope.  There were no medication changes made and no further testing that was ordered.  She returns to clinic today accompanied by her daughter stating that she has been doing well from a cardiac perspective.  She does endorse fatigue and just being tired in general.  She denies any chest pain, shortness of breath, peripheral edema, or syncopal/near syncopal episodes.  They state that  she just recently had a sleep study and was diagnosed with sleep apnea and is waiting for CPAP.  She continues to be off of all medication and only takes supplements.  Denies any hospitalization or visits to the emergency department.  ROS: 10 point review of systems has been reviewed and considered negative except what is listed in the HPI  Studies Reviewed: Kelsey Haley   EKG Interpretation Date/Time:  Thursday Aug 13 2023 15:19:35 EDT Ventricular Rate:  77 PR Interval:  128 QRS Duration:  88 QT Interval:  404 QTC Calculation: 457 R Axis:   63  Text Interpretation: Sinus rhythm with marked sinus arrhythmia Nonspecific ST abnormality When compared with ECG of 29-Jun-2007 14:17, No significant change was found Confirmed by Ronald Cockayne ([id number]) on 08/13/2023 3:28:56 PM    Event Monitor (Zio) 12/20/2021   The patient was monitored for 10 days, 16 hours.   The predominant rhythm was sinus with an average rate of 62 bpm (range 37-127 bpm in sinus).   There were frequent PAC's (16.5% burdern) and occasional PVC's (4.5-5% burden).   629 supraventricular runs were recorded, lasting up to 14 beats with a maximum rate of 193 bpm.   No sustained arrhythmia or prolonged pause was observed.   Patient triggered events correspond to sinus rhythm, PAC's, and PVC's.  Exercise MPI 11/29/2021 Exercise myocardial perfusion imaging study with no significant  ischemia Normal wall motion, EF estimated at 47% (EF possibly depressed secondary to GI uptake artifact) Resting EKG with nonspecific ST abnormality estimated at 1 mm depressions in anterolateral  and inferior leads Voltage criteria for LVH in the limb leads EKG changes concerning for ischemia at peak stress (2 mm depression), that resolves quickly in recovery. Target heart rate achieved, 164 bpm, 113% of maximum predicted heart rate, achieving 6.4 METS, exercise time 4 minutes 30 seconds CT attenuation correction images with no significant coronary calcification,  minimal aortic atherosclerosis Low risk scan   Risk Assessment/Calculations:             Physical Exam:   VS:  BP 120/74   Pulse 77   Ht 5\' 2"  (1.575 m)   Wt 140 lb 9.6 oz (63.8 kg)   SpO2 95%   BMI 25.72 kg/m    Wt Readings from Last 3 Encounters:  08/13/23 140 lb 9.6 oz (63.8 kg)  06/10/23 145 lb 8 oz (66 kg)  04/21/23 146 lb 2 oz (66.3 kg)    GEN: Well nourished, well developed in no acute distress NECK: No JVD; No carotid bruits CARDIAC: Irregularly irregular, no murmurs, rubs, gallops RESPIRATORY:  Clear to auscultation without rales, wheezing or rhonchi  ABDOMEN: Soft, non-tender, non-distended EXTREMITIES:  No edema; No deformity   ASSESSMENT AND PLAN: .   Near syncope, PACs, PVCs with EKG today showing sinus and sinus arrhythmia with occasional PACs with a rate of 77..  Patient has not experienced any further episodes of near syncope.  She denies any lightheadedness/dizziness.  Defer the addition of beta-blocker given average heart rate of 60-70 to prevent bradycardia which may exacerbate symptoms.  Mitral and aortic valve regurgitation with mild MR and AR noted on echo in June.  No symptoms reported.  Will continue with surveillance echoes every 3 to 5 years or sooner if symptoms of heart failure development.  At this time we will look at scheduling for an updated echocardiogram on return.  Sleep apnea where she is awaiting CPAP.  She has upcoming appointment with pulmonary.         Dispo: Patient return to clinic to see MD/APP in 11 to 12 months or sooner if needed.  Patient has been advised that with fatigue and a recent diagnosis of sleep apnea once that she starts with CPAP therapy if she is not noting improvement then we will see her back sooner if needed.    Signed, Cleophas Yoak, NP

## 2023-08-14 ENCOUNTER — Encounter: Payer: Self-pay | Admitting: Pulmonary Disease

## 2023-08-14 ENCOUNTER — Ambulatory Visit: Admitting: Pulmonary Disease

## 2023-08-14 VITALS — BP 120/68 | HR 66 | Temp 97.7°F | Ht 62.0 in | Wt 141.8 lb

## 2023-08-14 DIAGNOSIS — I080 Rheumatic disorders of both mitral and aortic valves: Secondary | ICD-10-CM

## 2023-08-14 DIAGNOSIS — G4719 Other hypersomnia: Secondary | ICD-10-CM | POA: Diagnosis not present

## 2023-08-14 DIAGNOSIS — G4733 Obstructive sleep apnea (adult) (pediatric): Secondary | ICD-10-CM | POA: Diagnosis not present

## 2023-08-14 NOTE — Progress Notes (Signed)
 Subjective:    Patient ID: Kelsey Haley, female    DOB: 1945-06-14, 78 y.o.   MRN: [id number]  Patient Care Team: Claire Crick, MD as PCP - [facility])  Chief Complaint  Patient presents with   Consult    Dx with severe sleep apnea 2 days ago.  Extreme daytime sleepiness. Snoring.    BACKGROUND: Patient presents for sleep evaluation.  Diagnosed with moderate sleep apnea 2 days ago via HST.  HPI Discussed the use of AI scribe software for clinical note transcription with the patient, who gave verbal consent to proceed.  History of Present Illness   Kelsey Haley is a 78 year old female who presents for a sleep consultation due to diagnosed moderate sleep apnea.  She presents with her daughter.  She has experienced persistent fatigue for the past ten years. A recent sleep study revealed an oxygen saturation level of 75% during sleep. She previously believed she was getting adequate deep sleep, as indicated by a sleep monitor showing two hours of deep sleep per night. She has not yet started any treatment for sleep apnea, as the condition was only identified two days ago.  She experiences significant daytime fatigue, impacting her ability to exercise, contributing to a cycle of fatigue and inactivity. She reports snoring and waking up with a dry mouth, indicating mouth breathing at night. Her husband has noted her snoring, and she sometimes falls asleep while watching television if uninterested. No episodes of falling asleep while standing or in public places.  She notes that she falls asleep very quickly like within 10 minutes.  She goes to bed typically between 11 PM to 12 AM.  She arises at 10 AM.  She wakes up 2-3 times during the night usually to use the bathroom.  Her past medical history includes a syncopal episode approximately one and a half years ago, which occurred in [place]  during hot weather. She has a history of arrhythmia and a valve issue, which were  initially investigated following the syncopal event. She is cautious with her diet and wants to increase her physical activity, but her fatigue has been a limiting factor.  Family history includes her mother's twin and uncle having similar health issues.      Snap HST: AHI 26, RDI 86, nadir O2 sat 75%; 54% spent with less than 88% saturation.  Review of Systems A 10 point review of systems was performed and it is as noted above otherwise negative.   Past Medical History:  Diagnosis Date   Encounter for Medicare annual wellness exam 05/16/2015   Estrogen deficiency 05/16/2015   Family history of uterine cancer 04/20/2017   Headache    occasional migranes   Hearing loss    Hearing loss 11/22/2008   Qualifier: Diagnosis of  By: Malissa Se MD, Dannette Dutch    HYPERCHOLESTEROLEMIA 01/08/2010   Qualifier: Diagnosis of  By: Comer Decamp CMA (AAMA), Natasha     INTERMITTENT VERTIGO 11/22/2008   Qualifier: Diagnosis of  By: Malissa Se MD, Dannette Dutch    Lipoma 12/20/2014   On L back    Osteopenia    Rash and nonspecific skin eruption 05/16/2015   Atopic? -occ neck or L hand  Uses triamcinolone     Screening mammogram, encounter for 11/12/2016   Stress headaches 05/16/2015   Vertigo    Vitamin D  deficiency 01/08/2010   Qualifier: Diagnosis of  By: Comer Decamp CMA (AAMA), Natasha     Zoster 10/04/2019    Past Surgical History:  Procedure Laterality Date   COLONOSCOPY WITH PROPOFOL  N/A 08/20/2015   Procedure: COLONOSCOPY WITH PROPOFOL ;  Surgeon: Cassie Click, MD;  Location[facility] ENDOSCOPY;  Service: Endoscopy;  Laterality: N/A;   COLONOSCOPY WITH PROPOFOL  N/A 02/18/2022   Procedure: COLONOSCOPY WITH PROPOFOL ;  Surgeon: Selena Daily, MD;  Location: [facility] ENDOSCOPY;  Service: Gastroenterology;  Laterality: N/A;   left knee surgery     TONSILLECTOMY      Patient Active Problem List   Diagnosis Date Noted   OSA (obstructive sleep apnea) 06/13/2023   Chronic fatigue 06/10/2023   Tick bite of right  thigh 09/08/2022   History of colonic polyps 02/18/2022   Cecal polyp 02/18/2022   Polyp of ascending colon 02/18/2022   Mitral and aortic regurgitation 01/31/2022   PAC (premature atrial contraction) 11/27/2021   PVC (premature ventricular contraction) 11/27/2021   Abnormal EKG 11/27/2021   Near syncope 09/24/2021   Pre-operative clearance 09/24/2021   Family history of uterine cancer 04/20/2017   Encounter for Medicare annual wellness exam 05/16/2015   Stress headaches 05/16/2015   Estrogen deficiency 05/16/2015   Lipoma 12/20/2014   Encounter for routine gynecological examination 08/16/2012   Routine general medical examination at a health care facility 08/10/2012   Vitamin D  deficiency 01/08/2010   HYPERCHOLESTEROLEMIA 01/08/2010   Hearing loss 11/22/2008   INTERMITTENT VERTIGO 11/22/2008   Osteopenia 01/27/2007    Family History  Problem Relation Age of Onset   Hypotension Mother    Hypertension Father    Prostate cancer Father    Uterine cancer Sister    Prostate cancer Brother    Breast cancer Neg Hx     Social History   Tobacco Use   Smoking status: Former    Current packs/day: 0.00    Types: Cigarettes    Quit date: 03/17/1994    Years since quitting: 29.4   Smokeless tobacco: Never   Tobacco comments:    was a light smoker before quitting  Substance Use Topics   Alcohol use: Not Currently    Alcohol/week: 1.0 standard drink of alcohol    Types: 1 Glasses of wine per week    Comment: occasional    No Known Allergies  Current Meds  Medication Sig   Ascorbic Acid (VITAMIN C) 1000 MG tablet Take 1,000 mg by mouth daily.   b complex vitamins capsule Take 1 capsule by mouth daily.   Calcium-Magnesium-Zinc 500-250-12.5 MG TABS Take 2 tablets by mouth daily.   Cholecalciferol (VITAMIN D -3) 5000 units TABS Take 1 tablet by mouth daily.   OVER THE COUNTER MEDICATION Take 3 capsules by mouth daily. TURMERIC, BIOPERINE, GARLIC, GINGER   OVER THE COUNTER  MEDICATION Take 1 capsule by mouth daily. MELATONIN, MAGNESIUM, B6, L-THIEANINE, VALERIAN, S HTP, ASHWAGANDA   Potassium Gluconate 550 MG TABS Take 1 tablet by mouth daily.   Turmeric (QC TUMERIC COMPLEX PO) Take by mouth.    Immunization History  Administered Date(s) Administered   Influenza, High Dose Seasonal PF 11/23/2017, 11/23/2017, 12/08/2022   Influenza,inj,Quad PF,6+ Mos 11/09/2013, 12/20/2014, 12/28/2018   Influenza-Unspecified 12/17/2012, 12/14/2016   PFIZER(Purple Top)SARS-COV-2 Vaccination 05/13/2019, 06/08/2019   Pfizer(Comirnaty)Fall Seasonal Vaccine 12 years and older 12/08/2022   Pneumococcal Conjugate-13 11/09/2013   Pneumococcal Polysaccharide-23 06/02/2011   Td 03/17/2001   Tdap 06/02/2011   Zoster, Live 12/15/2012        Objective:     BP 120/68 (BP Location: Right Arm, Patient Position: Sitting, Cuff Size: Normal)   Pulse 66   Temp  97.7 F (36.5 C) (Oral)   Ht 5\' 2"  (1.575 m)   Wt 141 lb 12.8 oz (64.3 kg)   SpO2 96%   BMI 25.94 kg/m   SpO2: 96 %  GENERAL: Well-developed, well-nourished woman, no acute distress. HEAD: Normocephalic, atraumatic.  EYES: Pupils equal, round, reactive to light.  No scleral icterus.  MOUTH: Crowded airway.  Teeth intact, no thrush.  Oral mucosa moist. NECK: Supple. No thyromegaly. Trachea midline. No JVD.  No adenopathy. PULMONARY: Good air entry bilaterally.  No adventitious sounds. CARDIOVASCULAR: S1 and S2. Regular rate and rhythm.  Grade 2/6 to 3/6 systolic ejection murmur at the left sternal border. ABDOMEN: Benign. MUSCULOSKELETAL: No joint deformity, no clubbing, no edema.  NEUROLOGIC: No overt focal deficit, no gait disturbance, speech is fluent. SKIN: Intact,warm,dry. PSYCH: Mood and behavior normal.     08/14/2023   10:00 AM  Results of the Epworth flowsheet  Sitting and reading 2  Watching TV 2  Sitting, inactive in a public place (e.g. a theatre or a meeting) 1  As a passenger in a car for an hour  without a break 3  Lying down to rest in the afternoon when circumstances permit 3  Sitting and talking to someone 1  Sitting quietly after a lunch without alcohol 2  In a car, while stopped for a few minutes in traffic 3  Total score 17         Assessment & Plan:     ICD-10-CM   1. OSA (obstructive sleep apnea)  G47.33     2. Mitral and aortic regurgitation  I08.0     3. Excessive daytime sleepiness  G47.19      Orders Placed This Encounter  Procedures   AMB REFERRAL FOR DME    Referral Priority:   Routine    Referral Type:   Durable Medical Equipment Purchase    Number of Visits Requested:   1   Discussion:    Sleep apnea Chronic sleep apnea confirmed by a sleep study with AHI of 26 and oxygen saturation at 75%. Symptoms include excessive daytime sleepiness and snoring, likely exacerbated by post-menopausal anatomical changes leading to increased airway resistance. Reports nocturnal mouth breathing and dry mouth. High Epworth Sleepiness Scale score indicates significant daytime sleepiness. No episodes of falling asleep while standing or in public places. Discussed CPAP therapy benefits, including improved sleep quality and reduced daytime fatigue. Explained adaptation period and compliance importance for optimal outcomes. Considered anatomical factors such as smaller nasal and oral cavities in post-menopausal women contributing to increased airway resistance. - Initiate CPAP therapy with a full face mask to accommodate mouth breathing. - Use Airview program to monitor CPAP usage. - Coordinate with [facility] for CPAP machine delivery and setup. - Schedule follow-up appointment three months after starting CPAP therapy to assess efficacy and make necessary adjustments.  Cardiac arrhythmia Cardiac arrhythmia potentially related to sleep apnea. Previous syncopal episode in hot weather conditions. Discussed the relationship between sleep apnea and arrhythmias, emphasizing the  importance of treating sleep apnea to potentially improve cardiac symptoms. - Monitor cardiac symptoms in conjunction with sleep apnea treatment. - Reassess cardiac status after initiation of CPAP therapy.      Advised if symptoms do not improve or worsen, to please contact office for sooner follow up or seek emergency care.    I spent 60 minutes of dedicated to the care of this patient on the date of this encounter to include pre-visit review of records, face-to-face time with  the patient discussing conditions above, post visit ordering of testing, clinical documentation with the electronic health record, making appropriate referrals as documented, and communicating necessary findings to members of the patients care team.   C. Chloe Counter, MD Advanced [facility]ch[facility]    *This note was dictated using voice recognition software/Dragon.  Despite best efforts to proofread, errors can occur which can change the meaning. Any transcriptional errors that result from this process are unintentional and may not be fully corrected at the time of dictation.

## 2023-08-14 NOTE — Patient Instructions (Addendum)
 VISIT SUMMARY:  Today, we discussed your recent diagnosis of sleep apnea and its impact on your overall health. We reviewed your symptoms, including daytime fatigue and snoring, and the results of your sleep study. We also talked about your history of cardiac arrhythmia and how it might be related to your sleep apnea.  YOUR PLAN:  -SLEEP APNEA: Sleep apnea is a condition where your breathing repeatedly stops and starts during sleep, leading to poor sleep quality and daytime fatigue. We will start you on CPAP therapy with a full face mask to help keep your airway open during sleep. This should improve your sleep quality and reduce daytime fatigue. We will use the Airview program to monitor your CPAP usage. [facility] will deliver and set up your CPAP machine. We will have a follow-up appointment in three months to see how you are doing and make any necessary adjustments.  -CARDIAC ARRHYTHMIA: Cardiac arrhythmia is an irregular heartbeat that can be related to sleep apnea. Treating your sleep apnea with CPAP therapy may help improve your heart symptoms. We will monitor your cardiac symptoms as you start your sleep apnea treatment and reassess your heart health after you begin using the CPAP machine.  INSTRUCTIONS:  Please start using the CPAP machine as soon as it is delivered and set up by [facility]. We will have a follow-up appointment in three months to assess how the CPAP therapy is working for you and make any necessary adjustments. In the meantime, monitor your cardiac symptoms and report any significant changes.    RESUMEN DE LA VISITA:  Hoy hablamos sobre su reciente diagnstico de apnea del sueo y su impacto en su salud general. Revisamos sus sntomas, incluyendo fatiga diurna y ronquidos, y los resultados de su estudio del sueo. Tambin hablamos sobre sus antecedentes de arritmia cardaca y su posible relacin con su apnea del sueo.  SU PLAN:  - APNEA DEL SUEO: La apnea del sueo es una  afeccin en la que la respiracin se detiene y reinicia repetidamente durante el sueo, lo que provoca mala calidad del sueo y [employer]ar[employer] manager. Iniciaremos su terapia de CPAP con Angeline Kemps facial completa para ayudar a Pharmacologist las vas respiratorias abiertas durante el sueo. Esto debera mejorar la calidad de su sueo y reducir la fatiga diurna. Utilizaremos el programa Airview para monitorear el uso de su CPAP. [facility] le entregar e instalar su mquina de CPAP. Tendremos una cita de seguimiento dentro de tres meses para evaluar su estado y Education officer, environmental los ajustes necesarios.  - ARRITMIA CARDACA: La arritmia cardaca es un latido cardaco irregular que puede estar relacionado con la apnea del sueo. Tratar su apnea del sueo con terapia CPAP puede ayudarle a mejorar sus sntomas cardacos. Monitorearemos sus sntomas cardacos al comenzar su tratamiento para la apnea del sueo y reevaluaremos su salud cardaca despus de que empiece a usar la mquina CPAP.  INSTRUCCIONES:  Comience a usar la mquina CPAP tan pronto como [facility] la entregue e instale. Tendremos una cita de seguimiento dentro de tres meses para evaluar cmo le est funcionando la terapia CPAP y Education officer, environmental los ajustes necesarios. Mientras tanto, monitoree sus sntomas cardacos e informe cualquier cambio significativo.

## 2023-08-31 ENCOUNTER — Telehealth: Payer: Self-pay

## 2023-08-31 NOTE — Telephone Encounter (Signed)
 Copied from CRM [id number]. Topic: Clinical - Order For Equipment >> Aug 31, 2023  1:59 PM Kelsey Haley B wrote: Reason for CRM: Patient's daughter called. Been waiting on phone call for 2 weeks. Was told would receive call to get her mom's CPAP machine and supplies. Have not heard anything. States mom oxygen is 75% saturation. Would like to get CPAP asap.  Please call patient or Daughter Kelsey Haley (would be easier) [phone].

## 2023-09-01 NOTE — Telephone Encounter (Signed)
 I have received a response from [place] with [facility] again [place], [place] Landsberg I did send and email to the local office and they called the patients daughter at [phone] as I requested and had to leave a message and no one has returned the call. I just sent them another message to please call her again.

## 2023-09-01 NOTE — Telephone Encounter (Signed)
 Patient daughter was not on the line when coming from speaking with cal . If patient daughter calls back please give the information for [facility] so they can call and speak with the company [phone] [facility]

## 2023-09-01 NOTE — Telephone Encounter (Signed)
 Spoke with cal and they gave me the number for [facility] to give to patient daughter to call the company and speak with them about mom supplies

## 2023-09-01 NOTE — Telephone Encounter (Signed)
 I have sent another urgent message to [facility] about this issue

## 2023-09-01 NOTE — Telephone Encounter (Signed)
 Patient daughter is calling back cause no one has reached out to her . She say no one has reached out  and the patient is in need of her cpap supplies. Patient daughter has called like 3 times

## 2023-09-07 DIAGNOSIS — G4733 Obstructive sleep apnea (adult) (pediatric): Secondary | ICD-10-CM | POA: Diagnosis not present

## 2023-10-07 DIAGNOSIS — G4733 Obstructive sleep apnea (adult) (pediatric): Secondary | ICD-10-CM | POA: Diagnosis not present

## 2023-10-14 NOTE — Telephone Encounter (Signed)
 I received a message from Kelsey Haley with [facility] [place], [place]   She was setup with her cpap on 09/07/2023 and you are linked to her in Airview, let me know if you can not see her.

## 2023-10-29 ENCOUNTER — Encounter: Payer: Self-pay | Admitting: Pulmonary Disease

## 2023-10-29 ENCOUNTER — Ambulatory Visit (INDEPENDENT_AMBULATORY_CARE_PROVIDER_SITE_OTHER): Admitting: Pulmonary Disease

## 2023-10-29 VITALS — BP 110/72 | HR 83 | Temp 98.5°F | Ht 62.0 in | Wt 144.6 lb

## 2023-10-29 DIAGNOSIS — G4719 Other hypersomnia: Secondary | ICD-10-CM

## 2023-10-29 DIAGNOSIS — I080 Rheumatic disorders of both mitral and aortic valves: Secondary | ICD-10-CM | POA: Diagnosis not present

## 2023-10-29 DIAGNOSIS — G4733 Obstructive sleep apnea (adult) (pediatric): Secondary | ICD-10-CM

## 2023-10-29 NOTE — Progress Notes (Signed)
 Subjective:    Patient ID: Kelsey Haley, female    DOB: Mar 20, 1945, 78 y.o.   MRN: [id number]  Patient Care Team: Rilla Baller, MD as PCP - [facility])  Chief Complaint  Patient presents with   Follow-up    Wearing CPAP every night.     BACKGROUND/INTERVAL:Patient presents for sleep evaluation. Diagnosed with moderate sleep apnea via HST.   HPI Discussed the use of AI scribe software for clinical note transcription with the patient, who gave verbal consent to proceed.  History of Present Illness   Kelsey Haley is a 78 year old female with sleep apnea who presents for follow-up regarding CPAP therapy.  She has been using her CPAP machine consistently and is adjusting well to it. There is a noted increase in energy levels since starting the therapy. However, she experiences some episodes of apnea, with counts ranging from six to ten, and once as high as nineteen.  She describes an incident where the CPAP mask was adjusted, and the tube was positioned over her head. She sometimes left the machine on when going to the bathroom, which she noticed made a difference in her symptoms.  There is a significant improvement in her Epworth Sleepiness Scale score, which decreased from seventeen to two.  She has resumed exercising, which she feels has contributed positively to her energy levels and overall well-being.    Her usage days on CPAP compliance report is 100%.  There is 100% usage of days over 4 hours.  Average usage per night is 8 hours and 11 minutes.  She is on AirSense 10 AutoSet at 5 to 15 cm H2O.  She is averaging pressures between 8-14 during usage.  Residual AHI still somewhat high at 11.  Review of Systems A 10 point review of systems was performed and it is as noted above otherwise negative.   Patient Active Problem List   Diagnosis Date Noted   OSA (obstructive sleep apnea) 06/13/2023   Chronic fatigue 06/10/2023   Tick bite of right thigh 09/08/2022    History of colonic polyps 02/18/2022   Cecal polyp 02/18/2022   Polyp of ascending colon 02/18/2022   Mitral and aortic regurgitation 01/31/2022   PAC (premature atrial contraction) 11/27/2021   PVC (premature ventricular contraction) 11/27/2021   Abnormal EKG 11/27/2021   Near syncope 09/24/2021   Pre-operative clearance 09/24/2021   Family history of uterine cancer 04/20/2017   Encounter for Medicare annual wellness exam 05/16/2015   Stress headaches 05/16/2015   Estrogen deficiency 05/16/2015   Lipoma 12/20/2014   Encounter for routine gynecological examination 08/16/2012   Routine general medical examination at a health care facility 08/10/2012   Vitamin D  deficiency 01/08/2010   HYPERCHOLESTEROLEMIA 01/08/2010   Hearing loss 11/22/2008   INTERMITTENT VERTIGO 11/22/2008   Osteopenia 01/27/2007    Social History   Tobacco Use   Smoking status: Former    Current packs/day: 0.00    Types: Cigarettes    Quit date: 03/17/1994    Years since quitting: 29.6   Smokeless tobacco: Never   Tobacco comments:    was a light smoker before quitting  Substance Use Topics   Alcohol use: Not Currently    Alcohol/week: 1.0 standard drink of alcohol    Types: 1 Glasses of wine per week    Comment: occasional    No Known Allergies  Current Meds  Medication Sig   Ascorbic Acid (VITAMIN C) 1000 MG tablet Take 1,000 mg by mouth  daily.   b complex vitamins capsule Take 1 capsule by mouth daily.   Calcium-Magnesium-Zinc 500-250-12.5 MG TABS Take 2 tablets by mouth daily.   Cholecalciferol (VITAMIN D -3) 5000 units TABS Take 1 tablet by mouth daily.   OVER THE COUNTER MEDICATION Take 3 capsules by mouth daily. TURMERIC, BIOPERINE, GARLIC, GINGER   OVER THE COUNTER MEDICATION Take 1 capsule by mouth daily. MELATONIN, MAGNESIUM, B6, L-THIEANINE, VALERIAN, S HTP, ASHWAGANDA   Potassium Gluconate 550 MG TABS Take 1 tablet by mouth daily.   Turmeric (QC TUMERIC COMPLEX PO) Take by mouth.     Immunization History  Administered Date(s) Administered   Influenza, High Dose Seasonal PF 11/23/2017, 11/23/2017, 12/08/2022   Influenza,inj,Quad PF,6+ Mos 11/09/2013, 12/20/2014, 12/28/2018   Influenza-Unspecified 12/17/2012, 12/14/2016   PFIZER(Purple Top)SARS-COV-2 Vaccination 05/13/2019, 06/08/2019   Pfizer(Comirnaty)Fall Seasonal Vaccine 12 years and older 12/08/2022   Pneumococcal Conjugate-13 11/09/2013   Pneumococcal Polysaccharide-23 06/02/2011   Td 03/17/2001   Tdap 06/02/2011   Zoster, Live 12/15/2012        Objective:     BP 110/72 (BP Location: Left Arm, Patient Position: Sitting, Cuff Size: Normal)   Pulse 83   Temp 98.5 F (36.9 C) (Oral)   Ht 5' 2 (1.575 m)   Wt 144 lb 9.6 oz (65.6 kg)   SpO2 96%   BMI 26.45 kg/m   SpO2: 96 %  GENERAL: Well-developed, well-nourished woman, no acute distress. HEAD: Normocephalic, atraumatic.  EYES: Pupils equal, round, reactive to light.  No scleral icterus.  MOUTH: Crowded airway.  Teeth intact, no thrush.  Oral mucosa moist. NECK: Supple. No thyromegaly. Trachea midline. No JVD.  No adenopathy. PULMONARY: Good air entry bilaterally.  No adventitious sounds. CARDIOVASCULAR: S1 and S2. Regular rate and rhythm.  Grade 2/6 to 3/6 systolic ejection murmur at the left sternal border. ABDOMEN: Benign. MUSCULOSKELETAL: No joint deformity, no clubbing, no edema.  NEUROLOGIC: No overt focal deficit, no gait disturbance, speech is fluent. SKIN: Intact,warm,dry. PSYCH: Mood and behavior normal.        10/29/2023    4:00 PM 08/14/2023   10:00 AM  Results of the Epworth flowsheet  Sitting and reading 0 2  Watching TV 0 2  Sitting, inactive in a public place (e.g. a theatre or a meeting) 0 1  As a passenger in a car for an hour without a break 0 3  Lying down to rest in the afternoon when circumstances permit 1 3  Sitting and talking to someone 0 1  Sitting quietly after a lunch without alcohol 1 2  In a car, while  stopped for a few minutes in traffic 0 3  Total score 2 17     Assessment & Plan:     ICD-10-CM   1. OSA (obstructive sleep apnea)  G47.33     2. Mitral and aortic regurgitation  I08.0     3. Excessive daytime sleepiness  G47.19    RESOLVED     Discussion:    Obstructive sleep apnea Obstructive sleep apnea with marked improvement in symptoms. She reports increased energy levels and has resumed exercising. She is adjusting to CPAP therapy, though occasional apneic episodes persist. The Epworth Sleepiness Scale score improved from 17 to 2. - Continue CPAP therapy for 90 days - Consult with Dr. Jess, sleep specialist, for further evaluation and management - Repeat the sleep apnea questionnaire to assess progress - Schedule follow-up appointment in three months      Advised if symptoms do not improve  or worsen, to please contact office for sooner follow up or seek emergency care.    I spent 32 minutes of dedicated to the care of this patient on the date of this encounter to include pre-visit review of records, face-to-face time with the patient discussing conditions above, post visit ordering of testing, clinical documentation with the electronic health record, making appropriate referrals as documented, and communicating necessary findings to members of the patients care team.     C. Leita Sanders, MD Advanced Bronchoscopy PCCM [facility]-    *This note was generated using voice recognition software/Dragon and/or AI transcription program.  Despite best efforts to proofread, errors can occur which can change the meaning. Any transcriptional errors that result from this process are unintentional and may not be fully corrected at the time of dictation.

## 2023-10-29 NOTE — Patient Instructions (Addendum)
 VISIT SUMMARY:  You came in for a follow-up regarding her CPAP therapy for sleep apnea. She has been using her CPAP machine consistently and has noticed an increase in energy levels and a significant improvement in her Epworth Sleepiness Scale score. She has also resumed exercising, which has positively impacted her overall well-being. However, she still experiences occasional episodes of apnea.  YOUR PLAN:  -OBSTRUCTIVE SLEEP APNEA: Obstructive sleep apnea is a condition where the airway becomes blocked during sleep, causing breathing to stop and start repeatedly. You have shown marked improvement in your symptoms with CPAP therapy, including increased energy levels and a better Epworth Sleepiness Scale score. Continue using your CPAP machine for the next 90 days.  We will reassess further consultation with Dr. Jess pending the results from your compliance record on the next visit.  Additionally, please repeat the sleep apnea questionnaire to assess your progress. We will schedule a follow-up appointment in three months.  INSTRUCTIONS:  Please continue using your CPAP machine as directed. Schedule a follow-up appointment in three months.  RESUMEN DE LA VISITA:  Acudi a una consulta de seguimiento sobre su terapia de CPAP para la apnea del sueo. Ha estado usando su mquina de CPAP de forma constante y ha notado un aumento en sus niveles de energa y ignacia knuckles significativa en su puntuacin en la Escala de Somnolencia de Epworth. Tambin ha reanudado el ejercicio, lo que ha mejorado su bienestar general. Sin embargo, an experimenta episodios ocasionales de apnea.  SU PLAN:  - APNEA OBSTRUCTIVA DEL SUEO: La apnea obstructiva del sueo es una afeccin en la que las vas respiratorias se obstruyen durante el sueo, lo que provoca que la respiracin se detenga y se reanude repetidamente. Ha mostrado una mejora notable de sus sntomas con la terapia de CPAP, incluyendo un aumento en sus niveles de  Engineer, drilling y burkina faso mejor puntuacin en la Escala de Somnolencia de Epworth. Contine usando su mquina de CPAP durante los prximos [address]. Reevaluaremos una consulta con el Dr. Jess en funcin de los resultados de su historial de cumplimiento en la prxima consulta. Adems, le rogamos que repita el cuestionario de apnea del sueo para evaluar su progreso. Programaremos una cita de seguimiento en tres meses. INSTRUCCIONES:  Contine usando su mquina CPAP segn las indicaciones. Programe una cita de seguimiento dentro de tres meses.

## 2023-11-07 DIAGNOSIS — G4733 Obstructive sleep apnea (adult) (pediatric): Secondary | ICD-10-CM | POA: Diagnosis not present

## 2023-11-14 ENCOUNTER — Encounter: Payer: Self-pay | Admitting: Pulmonary Disease

## 2023-12-08 ENCOUNTER — Ambulatory Visit (INDEPENDENT_AMBULATORY_CARE_PROVIDER_SITE_OTHER)

## 2023-12-08 VITALS — BP 100/64 | Ht 62.0 in | Wt 144.0 lb

## 2023-12-08 DIAGNOSIS — Z23 Encounter for immunization: Secondary | ICD-10-CM

## 2023-12-08 DIAGNOSIS — Z0000 Encounter for general adult medical examination without abnormal findings: Secondary | ICD-10-CM

## 2023-12-08 DIAGNOSIS — G4733 Obstructive sleep apnea (adult) (pediatric): Secondary | ICD-10-CM | POA: Diagnosis not present

## 2023-12-08 NOTE — Progress Notes (Signed)
 Subjective:   Kelsey Haley is a 78 y.o. who presents for a Medicare Wellness preventive visit.  As a reminder, Annual Wellness Visits don't include a physical exam, and some assessments may be limited, especially if this visit is performed virtually. We may recommend an in-person follow-up visit with your provider if needed.  Visit Complete: In person  Persons Participating in Visit: Patient.  AWV Questionnaire: No: Patient Medicare AWV questionnaire was not completed prior to this visit.  Cardiac Risk Factors include: advanced age (>3men, >49 women)     Objective:    Today's Vitals   12/08/23 1514  BP: 100/64  Weight: 144 lb (65.3 kg)  Height: 5' 2 (1.575 m)   Body mass index is 26.34 kg/m.     12/08/2023    3:35 PM 02/18/2022    9:13 AM 11/20/2021    2:32 PM  Advanced Directives  Does Patient Have a Medical Advance Directive? Yes No No  Type of Advance Directive Living will;Healthcare Power of Attorney    Copy of Healthcare Power of Attorney in Chart? No - copy requested    Would patient like information on creating a medical advance directive?   No - Patient declined    Current Medications (verified) Outpatient Encounter Medications as of 12/08/2023  Medication Sig   Ascorbic Acid (VITAMIN C) 1000 MG tablet Take 1,000 mg by mouth daily.   b complex vitamins capsule Take 1 capsule by mouth daily.   Calcium-Magnesium-Zinc 500-250-12.5 MG TABS Take 2 tablets by mouth daily.   Cholecalciferol (VITAMIN D -3) 5000 units TABS Take 1 tablet by mouth daily.   OVER THE COUNTER MEDICATION Take 3 capsules by mouth daily. TURMERIC, BIOPERINE, GARLIC, GINGER   OVER THE COUNTER MEDICATION Take 1 capsule by mouth daily. MELATONIN, MAGNESIUM, B6, L-THIEANINE, VALERIAN, S HTP, ASHWAGANDA   Potassium Gluconate 550 MG TABS Take 1 tablet by mouth daily.   Turmeric (QC TUMERIC COMPLEX PO) Take by mouth.   No facility-administered encounter medications on file as of 12/08/2023.     Allergies (verified) Patient has no known allergies.   History: Past Medical History:  Diagnosis Date   Encounter for Medicare annual wellness exam 05/16/2015   Estrogen deficiency 05/16/2015   Family history of uterine cancer 04/20/2017   Headache    occasional migranes   Hearing loss    Hearing loss 11/22/2008   Qualifier: Diagnosis of  By: Randeen MD, Laine Caldron    HYPERCHOLESTEROLEMIA 01/08/2010   Qualifier: Diagnosis of  By: Edmond CMA (AAMA), Natasha     INTERMITTENT VERTIGO 11/22/2008   Qualifier: Diagnosis of  By: Randeen MD, Laine Caldron    Lipoma 12/20/2014   On L back    Osteopenia    Rash and nonspecific skin eruption 05/16/2015   Atopic? -occ neck or L hand  Uses triamcinolone     Screening mammogram, encounter for 11/12/2016   Stress headaches 05/16/2015   Vertigo    Vitamin D  deficiency 01/08/2010   Qualifier: Diagnosis of  By: Edmond CMA (AAMA), Natasha     Zoster 10/04/2019   Past Surgical History:  Procedure Laterality Date   COLONOSCOPY WITH PROPOFOL  N/A 08/20/2015   Procedure: COLONOSCOPY WITH PROPOFOL ;  Surgeon: Lamar ONEIDA Holmes, MD;  Location: [facility];  Service: Endoscopy;  Laterality: N/A;   COLONOSCOPY WITH PROPOFOL  N/A 02/18/2022   Procedure: COLONOSCOPY WITH PROPOFOL ;  Surgeon: Unk Corinn Skiff, MD;  Location: [facility] ENDOSCOPY;  Service: Gastroenterology;  Laterality: N/A;   left knee surgery  TONSILLECTOMY     Family History  Problem Relation Age of Onset   Hypotension Mother    Hypertension Father    Prostate cancer Father    Uterine cancer Sister    Prostate cancer Brother    Breast cancer Neg Hx    Social History   Socioeconomic History   Marital status: Married    Spouse name: Not on file   Number of children: Not on file   Years of education: Not on file   Highest education level: Not on file  Occupational History   Not on file  Tobacco Use   Smoking status: Former    Current packs/day: 0.00    Types: Cigarettes     Quit date: 03/17/1994    Years since quitting: 29.7   Smokeless tobacco: Never   Tobacco comments:    was a light smoker before quitting  Vaping Use   Vaping status: Never Used  Substance and Sexual Activity   Alcohol use: Not Currently    Alcohol/week: 1.0 standard drink of alcohol    Types: 1 Glasses of wine per week    Comment: occasional   Drug use: No   Sexual activity: Not on file  Other Topics Concern   Not on file  Social History Narrative   Not on file   Social Drivers of Health   Financial Resource Strain: Low Risk  (12/08/2023)   Overall Financial Resource Strain (CARDIA)    Difficulty of Paying Living Expenses: Not hard at all  Food Insecurity: No Food Insecurity (12/08/2023)   Hunger Vital Sign    Worried About Running Out of Food in the Last Year: Never true    Ran Out of Food in the Last Year: Never true  Transportation Needs: No Transportation Needs (12/08/2023)   PRAPARE - Administrator, Civil Service (Medical): No    Lack of Transportation (Non-Medical): No  Physical Activity: Insufficiently Active (12/08/2023)   Exercise Vital Sign    Days of Exercise per Week: 3 days    Minutes of Exercise per Session: 30 min  Stress: No Stress Concern Present (12/08/2023)   [employer] of Occupational Health - Occupational Stress Questionnaire    Feeling of Stress: Not at all  Social Connections: Moderately Isolated (12/08/2023)   Social Connection and Isolation Panel    Frequency of Communication with Friends and Family: More than three times a week    Frequency of Social Gatherings with Friends and Family: More than three times a week    Attends Religious Services: Never    Database administrator or Organizations: No    Attends Engineer, structural: Never    Marital Status: Married    Tobacco Counseling Counseling given: Not Answered Tobacco comments: was a light smoker before quitting    Clinical Intake:  Pre-visit preparation  completed: Yes  Pain : No/denies pain     BMI - recorded: 26.34 Nutritional Status: BMI 25 -29 Overweight Nutritional Risks: None Diabetes: No  No results found for: HGBA1C   How often do you need to have someone help you when you read instructions, pamphlets, or other written materials from your doctor or pharmacy?: 1 - Never  Interpreter Needed?: No  Comments: lives with husband Information entered by :: B.Chong Wojdyla,LPN   Activities of Daily Living     12/08/2023    3:35 PM  In your present state of health, do you have any difficulty performing the following activities:  Hearing? 0  Vision? 0  Difficulty concentrating or making decisions? 0  Walking or climbing stairs? 0  Dressing or bathing? 0  Doing errands, shopping? 0  Preparing Food and eating ? N  Using the Toilet? N  In the past six months, have you accidently leaked urine? N  Do you have problems with loss of bowel control? N  Managing your Medications? N  Managing your Finances? N  Housekeeping or managing your Housekeeping? N    Patient Care Team: Rilla Baller, MD as PCP - General (Family Medicine) Ritter, Kevin J, OD (Optometry)  I have updated your Care Teams any recent Medical Services you may have received from other providers in the past year.     Assessment:   This is a routine wellness examination for Kelsey Haley.  Hearing/Vision screen Hearing Screening - Comments:: Patient denies any hearing difficulties.   Vision Screening - Comments:: Pt says their vision is good with glasses Dr  Lucio   Goals Addressed             This Visit's Progress    DIET - EAT MORE FRUITS AND VEGETABLES   On track    Patient Stated       no new goals        Depression Screen     12/08/2023    3:17 PM 06/10/2023   10:35 AM 04/21/2023    3:11 PM 09/08/2022    4:28 PM 11/20/2021    2:30 PM 10/15/2021    2:03 PM 07/29/2019   10:58 AM  PHQ 2/9 Scores  PHQ - 2 Score 0 0 0 0 0 0 0  PHQ- 9 Score  3 0 0 0  0     Fall Risk     12/08/2023    3:16 PM 06/10/2023   10:35 AM 04/21/2023    3:11 PM 09/08/2022    4:28 PM 11/20/2021    2:33 PM  Fall Risk   Falls in the past year? 0 0 0 0 0  Number falls in past yr: 0  0 0 0  Injury with Fall? 0  0 0 0  Risk for fall due to : No Fall Risks  No Fall Risks No Fall Risks No Fall Risks  Follow up Education provided;Falls prevention discussed  Falls evaluation completed Falls evaluation completed Falls evaluation completed      Data saved with a previous flowsheet row definition    MEDICARE RISK AT HOME:  Medicare Risk at Home Any stairs in or around the home?: Yes If so, are there any without handrails?: Yes Home free of loose throw rugs in walkways, pet beds, electrical cords, etc?: Yes Adequate lighting in your home to reduce risk of falls?: Yes Life alert?: No Use of a cane, walker or w/c?: No Grab bars in the bathroom?: No Shower chair or bench in shower?: No Elevated toilet seat or a handicapped toilet?: No  TIMED UP AND GO:  Was the test performed?  Yes  Length of time to ambulate 10 feet: 10 sec Gait steady and fast without use of assistive device  Cognitive Function: 6CIT completed        12/08/2023    3:19 PM  6CIT Screen  What Year? 0 points  What month? 0 points  What time? 0 points  Count back from 20 0 points  Months in reverse 0 points  Repeat phrase 0 points  Total Score 0 points    Immunizations Immunization History  Administered Date(s) Administered  INFLUENZA, HIGH DOSE SEASONAL PF 11/23/2017, 11/23/2017, 12/08/2022, 12/08/2023   Influenza,inj,Quad PF,6+ Mos 11/09/2013, 12/20/2014, 12/28/2018   Influenza-Unspecified 12/17/2012, 12/14/2016   PFIZER(Purple Top)SARS-COV-2 Vaccination 05/13/2019, 06/08/2019   Pfizer(Comirnaty)Fall Seasonal Vaccine 12 years and older 12/08/2022   Pneumococcal Conjugate-13 11/09/2013   Pneumococcal Polysaccharide-23 06/02/2011   Td 03/17/2001   Tdap 06/02/2011   Zoster, Live  12/15/2012    Screening Tests Health Maintenance  Topic Date Due   Zoster Vaccines- Shingrix (1 of 2) 03/06/1996   DTaP/Tdap/Td (3 - Td or Tdap) 06/01/2021   Mammogram  01/16/2023   COVID-19 Vaccine (4 - 2024-25 season) 11/16/2023   Medicare Annual Wellness (AWV)  12/07/2024   Colonoscopy  02/18/2025   Pneumococcal Vaccine: 50+ Years  Completed   Influenza Vaccine  Completed   DEXA SCAN  Completed   Hepatitis C Screening  Completed   HPV VACCINES  Aged Out   Meningococcal B Vaccine  Aged Out    Health Maintenance Items Addressed: Influenza given at visit today  Additional Screening:  Vision Screening: Recommended annual ophthalmology exams for early detection of glaucoma and other disorders of the eye. Is the patient up to date with their annual eye exam?  Yes  Who is the provider or what is the name of the office in which the patient attends annual eye exams? Dr Lucio  Dental Screening: Recommended annual dental exams for proper oral hygiene  Community Resource Referral / Chronic Care Management: CRR required this visit?  No   CCM required this visit?  Appt scheduled with PCP   Plan:    I have personally reviewed and noted the following in the patient's chart:   Medical and social history Use of alcohol, tobacco or illicit drugs  Current medications and supplements including opioid prescriptions. Patient is not currently taking opioid prescriptions. Functional ability and status Nutritional status Physical activity Advanced directives List of other physicians Hospitalizations, surgeries, and ER visits in previous 12 months Vitals Screenings to include cognitive, depression, and falls Referrals and appointments  In addition, I have reviewed and discussed with patient certain preventive protocols, quality metrics, and best practice recommendations. A written personalized care plan for preventive services as well as general preventive health recommendations were  provided to patient.   Erminio LITTIE Saris, LPN   [id number]   After Visit Summary: AVS printed and given to pt  Notes: Nothing significant to report at this time.

## 2023-12-08 NOTE — Patient Instructions (Addendum)
 Kelsey Haley,  Thank you for taking the time for your Medicare Wellness Visit. I appreciate your continued commitment to your health goals. Please review the care plan we discussed, and feel free to reach out if I can assist you further.  Medicare recommends these wellness visits once per year to help you and your care team stay ahead of potential health issues. These visits are designed to focus on prevention, allowing your provider to concentrate on managing your acute and chronic conditions during your regular appointments.  Please note that Annual Wellness Visits do not include a physical exam. Some assessments may be limited, especially if the visit was conducted virtually. If needed, we may recommend a separate in-person follow-up with your provider.  Ongoing Care Seeing your primary care provider every 3 to 6 months helps us  monitor your health and provide consistent, personalized care.   Referrals If a referral was made during today's visit and you haven't received any updates within two weeks, please contact the referred provider directly to check on the status.  Recommended Screenings:  Health Maintenance  Topic Date Due   Zoster (Shingles) Vaccine (1 of 2) 03/06/1996   DTaP/Tdap/Td vaccine (3 - Td or Tdap) 06/01/2021   Breast Cancer Screening  01/16/2023   COVID-19 Vaccine (4 - 2024-25 season) 11/16/2023   Medicare Annual Wellness Visit  12/07/2024   Colon Cancer Screening  02/18/2025   Pneumococcal Vaccine for age over 10  Completed   Flu Shot  Completed   DEXA scan (bone density measurement)  Completed   Hepatitis C Screening  Completed   HPV Vaccine  Aged Out   Meningitis B Vaccine  Aged Out       12/08/2023    3:35 PM  Advanced Directives  Does Patient Have a Medical Advance Directive? Yes  Type of Advance Directive Living will;Healthcare Power of Attorney  Copy of Healthcare Power of Attorney in Chart? No - copy requested   Advance Care Planning is important because  it: Ensures you receive medical care that aligns with your values, goals, and preferences. Provides guidance to your family and loved ones, reducing the emotional burden of decision-making during critical moments.  Vision: Annual vision screenings are recommended for early detection of glaucoma, cataracts, and diabetic retinopathy. These exams can also reveal signs of chronic conditions such as diabetes and high blood pressure.  Dental: Annual dental screenings help detect early signs of oral cancer, gum disease, and other conditions linked to overall health, including heart disease and diabetes.  Please see the attached documents for additional preventive care recommendations.

## 2024-01-24 ENCOUNTER — Other Ambulatory Visit: Payer: Self-pay | Admitting: Family Medicine

## 2024-01-24 DIAGNOSIS — E7800 Pure hypercholesterolemia, unspecified: Secondary | ICD-10-CM

## 2024-01-24 DIAGNOSIS — M8588 Other specified disorders of bone density and structure, other site: Secondary | ICD-10-CM

## 2024-01-24 DIAGNOSIS — E559 Vitamin D deficiency, unspecified: Secondary | ICD-10-CM

## 2024-01-26 ENCOUNTER — Ambulatory Visit: Admitting: Pulmonary Disease

## 2024-01-26 ENCOUNTER — Encounter: Payer: Self-pay | Admitting: Pulmonary Disease

## 2024-01-26 VITALS — BP 116/70 | HR 91 | Temp 98.1°F | Ht 62.0 in | Wt 146.0 lb

## 2024-01-26 DIAGNOSIS — G4733 Obstructive sleep apnea (adult) (pediatric): Secondary | ICD-10-CM

## 2024-01-26 DIAGNOSIS — I080 Rheumatic disorders of both mitral and aortic valves: Secondary | ICD-10-CM

## 2024-01-26 DIAGNOSIS — G4719 Other hypersomnia: Secondary | ICD-10-CM

## 2024-01-26 NOTE — Progress Notes (Unsigned)
 Subjective:    Patient ID: Kelsey Haley, female    DOB: 01-21-1946, 78 y.o.   MRN: [id number]  Patient Care Team: Rilla Baller, MD as PCP - General (Family Medicine) Lucio Franky PARAS, OD (Optometry)  No chief complaint on file.   BACKGROUND/INTERVAL:  HPI   Review of Systems A 10 point review of systems was performed and it is as noted above otherwise negative.   Patient Active Problem List   Diagnosis Date Noted   OSA (obstructive sleep apnea) 06/13/2023   Chronic fatigue 06/10/2023   Tick bite of right thigh 09/08/2022   History of colonic polyps 02/18/2022   Cecal polyp 02/18/2022   Polyp of ascending colon 02/18/2022   Mitral and aortic regurgitation 01/31/2022   PAC (premature atrial contraction) 11/27/2021   PVC (premature ventricular contraction) 11/27/2021   Abnormal EKG 11/27/2021   Near syncope 09/24/2021   Pre-operative clearance 09/24/2021   Family history of uterine cancer 04/20/2017   Encounter for Medicare annual wellness exam 05/16/2015   Stress headaches 05/16/2015   Estrogen deficiency 05/16/2015   Lipoma 12/20/2014   Encounter for routine gynecological examination 08/16/2012   Routine general medical examination at a health care facility 08/10/2012   Vitamin D  deficiency 01/08/2010   HYPERCHOLESTEROLEMIA 01/08/2010   Hearing loss 11/22/2008   INTERMITTENT VERTIGO 11/22/2008   Osteopenia 01/27/2007    Social History   Tobacco Use   Smoking status: Former    Current packs/day: 0.00    Types: Cigarettes    Quit date: 03/17/1994    Years since quitting: 29.8   Smokeless tobacco: Never   Tobacco comments:    was a light smoker before quitting  Substance Use Topics   Alcohol use: Not Currently    Alcohol/week: 1.0 standard drink of alcohol    Types: 1 Glasses of wine per week    Comment: occasional    No Known Allergies  No outpatient medications have been marked as taking for the 01/26/24 encounter (Appointment) with Tamea Dedra CROME, MD.    Immunization History  Administered Date(s) Administered   INFLUENZA, HIGH DOSE SEASONAL PF 11/23/2017, 11/23/2017, 12/08/2022, 12/08/2023   Influenza,inj,Quad PF,6+ Mos 11/09/2013, 12/20/2014, 12/28/2018   Influenza-Unspecified 12/17/2012, 12/14/2016   PFIZER(Purple Top)SARS-COV-2 Vaccination 05/13/2019, 06/08/2019   Pfizer(Comirnaty)Fall Seasonal Vaccine 12 years and older 12/08/2022   Pneumococcal Conjugate-13 11/09/2013   Pneumococcal Polysaccharide-23 06/02/2011   Td 03/17/2001   Tdap 06/02/2011   Zoster, Live 12/15/2012        Objective:     There were no vitals taken for this visit.     GENERAL: HEAD: Normocephalic, atraumatic.  EYES: Pupils equal, round, reactive to light.  No scleral icterus.  MOUTH:  NECK: Supple. No thyromegaly. Trachea midline. No JVD.  No adenopathy. PULMONARY: Good air entry bilaterally.  No adventitious sounds. CARDIOVASCULAR: S1 and S2. Regular rate and rhythm.  ABDOMEN: MUSCULOSKELETAL: No joint deformity, no clubbing, no edema.  NEUROLOGIC:  SKIN: Intact,warm,dry. PSYCH:        Assessment & Plan:   No diagnosis found.  No orders of the defined types were placed in this encounter.   No orders of the defined types were placed in this encounter.     Advised if symptoms do not improve or worsen, to please contact office for sooner follow up or seek emergency care.    I spent xxx minutes of dedicated to the care of this patient on the date of this encounter to include pre-visit review of records,  face-to-face time with the patient discussing conditions above, post visit ordering of testing, clinical documentation with the electronic health record, making appropriate referrals as documented, and communicating necessary findings to members of the patients care team.     C. Leita Sanders, MD Advanced Bronchoscopy PCCM [facility]    *This note was generated using voice recognition  software/Dragon and/or AI transcription program.  Despite best efforts to proofread, errors can occur which can change the meaning. Any transcriptional errors that result from this process are unintentional and may not be fully corrected at the time of dictation.

## 2024-01-26 NOTE — Patient Instructions (Addendum)
 VISIT SUMMARY:  Kelsey Haley, you had a follow-up visit today to discuss your obstructive sleep apnea. You reported that you are adjusting well to using the CPAP mask and no longer feel tired during the day. You are sleeping well and feel well-rested, although you have noticed some apnea episodes when you wake up at night to use the bathroom.  YOUR PLAN:  -OBSTRUCTIVE SLEEP APNEA: Obstructive sleep apnea is a condition where your breathing stops and starts repeatedly during sleep due to blocked airways. Your condition is well-managed with CPAP therapy, which helps keep your airways open. You reported improved sleep quality and reduced daytime fatigue. The occasional apnea episodes you experience at night when you wake up to use the bathroom are likely due to mask displacement or air leaks. Overall, you are satisfied with the treatment and have no significant issues with the CPAP mask.  INSTRUCTIONS:  Please continue using your CPAP machine every night as you have been. We have scheduled a follow-up appointment for you in six months to monitor your progress.  RESUMEN DE LA VISITA:  [name], hoy tuvo una cita de seguimiento para hablar sobre su apnea obstructiva del sueo. Nos coment que se est adaptando bien al uso de la mascarilla CPAP y que ya no se siente cansada administrator. Duerme bien y se siente descansada, aunque ha notado algunos episodios de apnea cuando se despierta por la noche para ir al bao.  SU PLAN:  - APNEA OBSTRUCTIVA DEL SUEO: La apnea obstructiva del sueo es una afeccin en la que la respiracin se detiene y se reanuda repetidamente durante el sueo debido a la obstruccin de las vas respiratorias. Su afeccin est bien controlada con la terapia CPAP, que ayuda a pharmacologist las vas respiratorias abiertas. Usted report una mejora en la calidad del sueo y una reduccin de la fatiga diurna. Los episodios ocasionales de apnea que experimenta por la noche al  despertarse para ir al bao probablemente se deban al desplazamiento de la mascarilla o a fugas de aire. En general, est satisfecha con el tratamiento y no tiene problemas importantes con la mascarilla CPAP.  INSTRUCCIONES:  Por favor, contine usando su mquina CPAP todas las noches como lo ha [employer]. Hemos programado una cita de seguimiento para usted dentro de seis meses para controlar su evolucin.

## 2024-01-27 ENCOUNTER — Other Ambulatory Visit (INDEPENDENT_AMBULATORY_CARE_PROVIDER_SITE_OTHER)

## 2024-01-27 ENCOUNTER — Encounter: Payer: Self-pay | Admitting: Pulmonary Disease

## 2024-01-27 DIAGNOSIS — E559 Vitamin D deficiency, unspecified: Secondary | ICD-10-CM

## 2024-01-27 DIAGNOSIS — E7800 Pure hypercholesterolemia, unspecified: Secondary | ICD-10-CM

## 2024-01-27 DIAGNOSIS — M8588 Other specified disorders of bone density and structure, other site: Secondary | ICD-10-CM | POA: Diagnosis not present

## 2024-01-27 LAB — COMPREHENSIVE METABOLIC PANEL WITH GFR
ALT: 16 U/L (ref 0–35)
AST: 20 U/L (ref 0–37)
Albumin: 4.4 g/dL (ref 3.5–5.2)
Alkaline Phosphatase: 47 U/L (ref 39–117)
BUN: 19 mg/dL (ref 6–23)
CO2: 30 meq/L (ref 19–32)
Calcium: 9.5 mg/dL (ref 8.4–10.5)
Chloride: 103 meq/L (ref 96–112)
Creatinine, Ser: 0.72 mg/dL (ref 0.40–1.20)
GFR: 80.38 mL/min (ref >60.00)
Glucose, Bld: 88 mg/dL (ref 70–99)
Potassium: 4.5 meq/L (ref 3.5–5.1)
Sodium: 139 meq/L (ref 135–145)
Total Bilirubin: 0.7 mg/dL (ref 0.2–1.2)
Total Protein: 6.8 g/dL (ref 6.0–8.3)

## 2024-01-27 LAB — LIPID PANEL
Cholesterol: 223 mg/dL — ABNORMAL HIGH (ref 0–200)
HDL: 79.1 mg/dL (ref >39.00)
LDL Cholesterol: 133 mg/dL — ABNORMAL HIGH (ref 0–99)
NonHDL: 144.2
Total CHOL/HDL Ratio: 3
Triglycerides: 58 mg/dL (ref 0.0–149.0)
VLDL: 11.6 mg/dL (ref 0.0–40.0)

## 2024-01-27 LAB — VITAMIN D 25 HYDROXY (VIT D DEFICIENCY, FRACTURES): VITD: 45.94 ng/mL (ref 30.00–100.00)

## 2024-01-28 ENCOUNTER — Ambulatory Visit: Payer: Self-pay | Admitting: Family Medicine

## 2024-02-03 ENCOUNTER — Encounter: Admitting: Family Medicine

## 2024-02-07 DIAGNOSIS — G4733 Obstructive sleep apnea (adult) (pediatric): Secondary | ICD-10-CM | POA: Diagnosis not present

## 2024-03-08 ENCOUNTER — Ambulatory Visit: Admitting: Family Medicine

## 2024-03-08 ENCOUNTER — Encounter: Payer: Self-pay | Admitting: Family Medicine

## 2024-03-08 VITALS — BP 130/78 | HR 89 | Temp 97.6°F | Ht 61.81 in | Wt 146.0 lb

## 2024-03-08 DIAGNOSIS — I493 Ventricular premature depolarization: Secondary | ICD-10-CM

## 2024-03-08 DIAGNOSIS — G4733 Obstructive sleep apnea (adult) (pediatric): Secondary | ICD-10-CM

## 2024-03-08 DIAGNOSIS — E559 Vitamin D deficiency, unspecified: Secondary | ICD-10-CM | POA: Diagnosis not present

## 2024-03-08 DIAGNOSIS — Z7189 Other specified counseling: Secondary | ICD-10-CM | POA: Diagnosis not present

## 2024-03-08 DIAGNOSIS — E7800 Pure hypercholesterolemia, unspecified: Secondary | ICD-10-CM | POA: Diagnosis not present

## 2024-03-08 DIAGNOSIS — H9193 Unspecified hearing loss, bilateral: Secondary | ICD-10-CM

## 2024-03-08 DIAGNOSIS — R5382 Chronic fatigue, unspecified: Secondary | ICD-10-CM | POA: Diagnosis not present

## 2024-03-08 DIAGNOSIS — M8588 Other specified disorders of bone density and structure, other site: Secondary | ICD-10-CM | POA: Diagnosis not present

## 2024-03-08 DIAGNOSIS — Z23 Encounter for immunization: Secondary | ICD-10-CM

## 2024-03-08 DIAGNOSIS — Z0000 Encounter for general adult medical examination without abnormal findings: Secondary | ICD-10-CM

## 2024-03-08 MED ORDER — CALCIUM-MAGNESIUM-ZINC 500-250-12.5 MG PO TABS: 1.0000 | ORAL_TABLET | Freq: Every day | ORAL | Status: AC

## 2024-03-08 NOTE — Patient Instructions (Addendum)
 Prevnar-20 today Call to schedule mammogram at your convenience: [facility] at [facility] [phone]. Revise en la farmacia cuando recibio la vacuna de Shingrix y dejeme saber fecha, y pregunte sobre 2nda.  Advanced directive packet provided today  Gusto verla hoy!  Regresar en 1 ao para proximo examen fisico.

## 2024-03-08 NOTE — Assessment & Plan Note (Signed)
 Preventative protocols reviewed and updated unless pt declined. Discussed healthy diet and lifestyle.

## 2024-03-08 NOTE — Assessment & Plan Note (Signed)
 Advanced directive discussion - does not have this. Would want husband to be HCPOA. Family is aware. Packet provided today.

## 2024-03-08 NOTE — Progress Notes (Unsigned)
 " Ph: [phone] Fax: [phone]   Patient ID: Kelsey Haley, female    DOB: 02/12/1946, 78 y.o.   MRN: [id number]  This visit was conducted in person.  BP 130/78 (Cuff Size: Normal)   Pulse 89   Temp 97.6 F (36.4 C) (Oral)   Ht 5' 1.81 (1.57 m)   Wt 146 lb (66.2 kg)   SpO2 97%   BMI 26.87 kg/m   BP Readings from Last 3 Encounters:  03/08/24 130/78  01/26/24 116/70  12/08/23 100/64   CC: CPE Subjective:   HPI: Kelsey Haley is a 78 y.o. female presenting on 03/08/2024 for Annual Exam (Pt has no concerns, said she is doing much better since last visit and has more energy now)   Planning trip to [place] 05/2023 - will see Fernanda.   Saw health advisor  11/2023 for medicare wellness visit. Note reviewed.   No results found.  Flowsheet Row Clinical Support from 12/08/2023 in [facility]  PHQ-2 Total Score 0       03/08/2024    2:25 PM 12/08/2023    3:16 PM 06/10/2023   10:35 AM 04/21/2023    3:11 PM 09/08/2022    4:28 PM  Fall Risk   Falls in the past year? 0 0 0 0 0  Number falls in past yr: 0 0  0 0  Injury with Fall? 0 0   0  0   Risk for fall due to :  No Fall Risks  No Fall Risks No Fall Risks  Follow up Falls evaluation completed Education provided;Falls prevention discussed  Falls evaluation completed Falls evaluation completed     Data saved with a previous flowsheet row definition    Since last seen, found to have moderate OSA, established with pulm Dr Tamea and started on auto-CPAP treatment which has resolved daytime somnolence/fatigue symptoms.   HST 07/2023 - AHI 26, RDI 86, nadir O2 sat 75%, 54% time spent <88% saturation - rec CPAP.   Takes turmeric for joint pains.   Preventative: Colonoscopy 02/2022 - 6 polyps - rec rpt 3-5 yrs ([facility]) Mammogram 01/2022 - [facility] @ [place] - # provided to call for rpt  Well woman exam - remotely, no vaginal bleeding or pelvic pain or skin changes. Last pap 2014.  Lung  cancer screening - not eligible  DEXA 03/2018 - T -1.2 spine DEXA 01/2022 - T -0.9 spine overall improved (normal) Flu shot - yearly COVID shot - x at least 5.  Tdap 2013 Pneumovax 2013, prevnar-13 2015, prevnar-20 - today  Zostavax 12/2012 Shingrix - received 1 of 2 shingles shots - will check with pharmacy Advanced directive discussion - does not have this. Would want husband to be HCPOA. Family is aware. Packet provided today.  Seat belt use discussed Sunscreen use discussed. No changing moles on skin. Ex smoker - stopped in her 46s, socially Alcohol - [employer] - occasionally - sees in [place]  Eye exam - yearly - needs cataract surgery  Bowel - no constipation  Bladder -  no incontinence   Lives with husband Eric Has 3 children  Activity: walking, uses rower, stationary bicycle Diet: good water, fruits/vegetables regularly, mate tea, limits read meat      Relevant past medical, surgical, family and social history reviewed and updated as indicated. Interim medical history since our last visit reviewed. Allergies and medications reviewed and updated. Outpatient Medications Prior to Visit  Medication Sig Dispense  Refill   Ascorbic Acid (VITAMIN C) 1000 MG tablet Take 1,000 mg by mouth daily.     b complex vitamins capsule Take 1 capsule by mouth daily.     Cholecalciferol (VITAMIN D -3) 5000 units TABS Take 1 tablet by mouth daily.     OVER THE COUNTER MEDICATION Take 3 capsules by mouth daily. TURMERIC, BIOPERINE, GARLIC, GINGER     OVER THE COUNTER MEDICATION Take 1 capsule by mouth daily. MELATONIN, MAGNESIUM , B6, L-THIEANINE, VALERIAN, S HTP, ASHWAGANDA     Potassium Gluconate 550 MG TABS Take 1 tablet by mouth daily.     Turmeric (QC TUMERIC COMPLEX PO) Take by mouth.     Calcium -Magnesium -Zinc  500-250-12.5 MG TABS Take 2 tablets by mouth daily.     No facility-administered medications prior to visit.     Per HPI unless specifically indicated in ROS  section below Review of Systems  Constitutional:  Negative for activity change, appetite change, chills, fatigue, fever and unexpected weight change.  HENT:  Negative for hearing loss.   Eyes:  Negative for visual disturbance.  Respiratory:  Negative for cough, chest tightness, shortness of breath and wheezing.   Cardiovascular:  Negative for chest pain, palpitations and leg swelling.  Gastrointestinal:  Negative for abdominal distention, abdominal pain, blood in stool, constipation, diarrhea, nausea and vomiting.  Genitourinary:  Negative for difficulty urinating and hematuria.  Musculoskeletal:  Negative for arthralgias, myalgias and neck pain.  Skin:  Negative for rash.  Neurological:  Negative for dizziness, seizures, syncope and headaches.  Hematological:  Negative for adenopathy. Does not bruise/bleed easily.  Psychiatric/Behavioral:  Negative for dysphoric mood. The patient is not nervous/anxious.     Objective:  BP 130/78 (Cuff Size: Normal)   Pulse 89   Temp 97.6 F (36.4 C) (Oral)   Ht 5' 1.81 (1.57 m)   Wt 146 lb (66.2 kg)   SpO2 97%   BMI 26.87 kg/m   Wt Readings from Last 3 Encounters:  03/08/24 146 lb (66.2 kg)  01/26/24 146 lb (66.2 kg)  12/08/23 144 lb (65.3 kg)      Physical Exam Vitals and nursing note reviewed.  Constitutional:      Appearance: Normal appearance. She is not ill-appearing.  HENT:     Head: Normocephalic and atraumatic.     Right Ear: Tympanic membrane, ear canal and external ear normal. There is no impacted cerumen.     Left Ear: Tympanic membrane, ear canal and external ear normal. There is no impacted cerumen.     Mouth/Throat:     Mouth: Mucous membranes are moist.     Pharynx: Oropharynx is clear. No oropharyngeal exudate or posterior oropharyngeal erythema.  Eyes:     General:        Right eye: No discharge.        Left eye: No discharge.     Extraocular Movements: Extraocular movements intact.     Conjunctiva/sclera:  Conjunctivae normal.     Pupils: Pupils are equal, round, and reactive to light.  Neck:     Thyroid : No thyroid  mass or thyromegaly.     Vascular: No carotid bruit.  Cardiovascular:     Rate and Rhythm: Normal rate and regular rhythm. Frequent Extrasystoles are present.    Pulses: Normal pulses.     Heart sounds: Normal heart sounds. No murmur heard.    Comments: H/o marked sinus arrhythmia Pulmonary:     Effort: Pulmonary effort is normal. No respiratory distress.     Breath  sounds: Normal breath sounds. No wheezing, rhonchi or rales.  Abdominal:     General: Bowel sounds are normal. There is no distension.     Palpations: Abdomen is soft. There is no mass.     Tenderness: There is no abdominal tenderness. There is no guarding or rebound.     Hernia: No hernia is present.  Musculoskeletal:     Cervical back: Normal range of motion and neck supple. No rigidity.     Right lower leg: No edema.     Left lower leg: No edema.  Lymphadenopathy:     Cervical: No cervical adenopathy.  Skin:    General: Skin is warm and dry.     Findings: No rash.  Neurological:     General: No focal deficit present.     Mental Status: She is alert. Mental status is at baseline.  Psychiatric:        Mood and Affect: Mood normal.        Behavior: Behavior normal.       Results for orders placed or performed in visit on 01/27/24  VITAMIN D  25 Hydroxy (Vit-D Deficiency, Fractures)   Collection Time: 01/27/24  8:53 AM  Result Value Ref Range   VITD 45.94 30.00 - 100.00 ng/mL  Comprehensive metabolic panel with GFR   Collection Time: 01/27/24  8:53 AM  Result Value Ref Range   Sodium 139 135 - 145 mEq/L   Potassium 4.5 3.5 - 5.1 mEq/L   Chloride 103 96 - 112 mEq/L   CO2 30 19 - 32 mEq/L   Glucose, Bld 88 70 - 99 mg/dL   BUN 19 6 - 23 mg/dL   Creatinine, Ser 9.27 0.40 - 1.20 mg/dL   Total Bilirubin 0.7 0.2 - 1.2 mg/dL   Alkaline Phosphatase 47 39 - 117 U/L   AST 20 0 - 37 U/L   ALT 16 0 - 35  U/L   Total Protein 6.8 6.0 - 8.3 g/dL   Albumin 4.4 3.5 - 5.2 g/dL   GFR 19.61 >39.99 mL/min   Calcium  9.5 8.4 - 10.5 mg/dL  Lipid panel   Collection Time: 01/27/24  8:53 AM  Result Value Ref Range   Cholesterol 223 (H) 0 - 200 mg/dL   Triglycerides 41.9 0.0 - 149.0 mg/dL   HDL 20.89 >60.99 mg/dL   VLDL 88.3 0.0 - 59.9 mg/dL   LDL Cholesterol 866 (H) 0 - 99 mg/dL   Total CHOL/HDL Ratio 3    NonHDL 144.20     Assessment & Plan:   Problem List Items Addressed This Visit     Routine general medical examination at a health care facility - Primary (Chronic)   Preventative protocols reviewed and updated unless pt declined. Discussed healthy diet and lifestyle.       Advanced directives, counseling/discussion (Chronic)   Advanced directive discussion - does not have this. Would want husband to be HCPOA. Family is aware. Packet provided today.       Other Visit Diagnoses       Need for vaccination against Streptococcus pneumoniae       Relevant Orders   Pneumococcal conjugate vaccine 20-valent (Prevnar 20) (Completed)        Meds ordered this encounter  Medications   Calcium -Magnesium -Zinc  500-250-12.5 MG TABS    Sig: Take 1 tablet by mouth daily.    Orders Placed This Encounter  Procedures   Pneumococcal conjugate vaccine 20-valent (Prevnar 20)    Patient Instructions  Prevnar-20 today Call  to schedule mammogram at your convenience: [facility] at [facility] [phone]. Revise en la farmacia cuando recibio la vacuna de Shingrix y dejeme saber fecha, y pregunte sobre 2nda.  Advanced directive packet provided today  Gusto verla hoy!  Regresar en 1 ao para proximo examen fisico.   Follow up plan: Return in about 1 year (around 03/08/2025), or if symptoms worsen or fail to improve, for annual exam, prior fasting for blood work, medicare wellness visit.  Anton Blas, MD   "

## 2024-03-11 NOTE — Assessment & Plan Note (Addendum)
 Chronic, mildly elevated Reviewed diet choices to improve cholesterol control. Overall healthy diet - suspect hereditary component to HLD Will not start statin at this time.  The 10-year ASCVD risk score (Arnett DK, et al., 2019) is: 23.4%   Values used to calculate the score:     Age: 78 years     Clinically relevant sex: Female     Is Non-Hispanic African American: No     Diabetic: No     Tobacco smoker: No     Systolic Blood Pressure: 130 mmHg     Is BP treated: No     HDL Cholesterol: 79.1 mg/dL     Total Cholesterol: 223 mg/dL

## 2024-03-11 NOTE — Assessment & Plan Note (Signed)
 H/o frequent PVC/ PACs - exam today with significant extrasystoles, pt asxs. Will continue to monitor

## 2024-03-11 NOTE — Assessment & Plan Note (Signed)
 Continue vit D 5000 units daily + cal/D

## 2024-03-11 NOTE — Assessment & Plan Note (Signed)
 Significant improvement since starting CPAP

## 2024-03-11 NOTE — Assessment & Plan Note (Signed)
 Wears hearing aides.

## 2024-03-11 NOTE — Assessment & Plan Note (Signed)
 Stable period on CPAP Appreciate pulm care.  She tolerates and benefits from regular CPAP use

## 2024-03-11 NOTE — Assessment & Plan Note (Signed)
 Actually latest DEXA showing normal range T scores.  Continue regular weight bearing exercise, good dietary calcium  intake, and cal+ D oral intake.

## 2024-04-01 ENCOUNTER — Ambulatory Visit: Admitting: Family Medicine

## 2024-12-08 ENCOUNTER — Ambulatory Visit

## 2024-12-09 ENCOUNTER — Ambulatory Visit
